# Patient Record
Sex: Male | Born: 1960
Health system: Southern US, Community
[De-identification: ages and names within clinical notes are randomized; demographics above are authoritative.]

## PROBLEM LIST (undated history)

## (undated) ENCOUNTER — Emergency Department (HOSPITAL_COMMUNITY): Admission: EM | Payer: Self-pay | Source: Home / Self Care

## (undated) DIAGNOSIS — T7840XA Allergy, unspecified, initial encounter: Secondary | ICD-10-CM

## (undated) DIAGNOSIS — R079 Chest pain, unspecified: Secondary | ICD-10-CM

## (undated) DIAGNOSIS — I82419 Acute embolism and thrombosis of unspecified femoral vein: Secondary | ICD-10-CM

## (undated) DIAGNOSIS — D689 Coagulation defect, unspecified: Secondary | ICD-10-CM

## (undated) DIAGNOSIS — E119 Type 2 diabetes mellitus without complications: Secondary | ICD-10-CM

## (undated) DIAGNOSIS — I10 Essential (primary) hypertension: Secondary | ICD-10-CM

## (undated) HISTORY — DX: Coagulation defect, unspecified: D68.9

## (undated) HISTORY — DX: Allergy, unspecified, initial encounter: T78.40XA

## (undated) HISTORY — PX: WISDOM TOOTH EXTRACTION: SHX21

## (undated) HISTORY — DX: Chest pain, unspecified: R07.9

## (undated) HISTORY — DX: Essential (primary) hypertension: I10

---

## 1999-11-09 ENCOUNTER — Encounter: Payer: Self-pay | Admitting: Emergency Medicine

## 1999-11-09 ENCOUNTER — Emergency Department (HOSPITAL_COMMUNITY): Admission: EM | Admit: 1999-11-09 | Discharge: 1999-11-09 | Payer: Self-pay | Admitting: Emergency Medicine

## 2003-01-13 ENCOUNTER — Emergency Department (HOSPITAL_COMMUNITY): Admission: EM | Admit: 2003-01-13 | Discharge: 2003-01-13 | Payer: Self-pay | Admitting: Emergency Medicine

## 2003-03-02 ENCOUNTER — Ambulatory Visit (HOSPITAL_BASED_OUTPATIENT_CLINIC_OR_DEPARTMENT_OTHER): Admission: RE | Admit: 2003-03-02 | Discharge: 2003-03-02 | Payer: Self-pay | Admitting: Urology

## 2003-06-01 ENCOUNTER — Emergency Department (HOSPITAL_COMMUNITY): Admission: EM | Admit: 2003-06-01 | Discharge: 2003-06-01 | Payer: Self-pay | Admitting: Emergency Medicine

## 2003-06-01 ENCOUNTER — Encounter: Payer: Self-pay | Admitting: Emergency Medicine

## 2004-04-02 ENCOUNTER — Emergency Department (HOSPITAL_COMMUNITY): Admission: EM | Admit: 2004-04-02 | Discharge: 2004-04-02 | Payer: Self-pay

## 2005-02-26 ENCOUNTER — Emergency Department (HOSPITAL_COMMUNITY): Admission: EM | Admit: 2005-02-26 | Discharge: 2005-02-26 | Payer: Self-pay | Admitting: Emergency Medicine

## 2005-07-10 ENCOUNTER — Emergency Department (HOSPITAL_COMMUNITY): Admission: EM | Admit: 2005-07-10 | Discharge: 2005-07-10 | Payer: Self-pay | Admitting: Emergency Medicine

## 2007-07-01 ENCOUNTER — Emergency Department (HOSPITAL_COMMUNITY): Admission: EM | Admit: 2007-07-01 | Discharge: 2007-07-01 | Payer: Self-pay | Admitting: Emergency Medicine

## 2007-10-05 ENCOUNTER — Emergency Department (HOSPITAL_COMMUNITY): Admission: EM | Admit: 2007-10-05 | Discharge: 2007-10-05 | Payer: Self-pay | Admitting: Emergency Medicine

## 2009-04-06 ENCOUNTER — Emergency Department (HOSPITAL_COMMUNITY): Admission: EM | Admit: 2009-04-06 | Discharge: 2009-04-06 | Payer: Self-pay | Admitting: Emergency Medicine

## 2009-09-03 ENCOUNTER — Emergency Department (HOSPITAL_COMMUNITY): Admission: EM | Admit: 2009-09-03 | Discharge: 2009-09-03 | Payer: Self-pay | Admitting: Emergency Medicine

## 2010-03-25 ENCOUNTER — Emergency Department (HOSPITAL_COMMUNITY): Admission: EM | Admit: 2010-03-25 | Discharge: 2010-03-25 | Payer: Self-pay | Admitting: Emergency Medicine

## 2011-01-27 LAB — CBC
Hemoglobin: 12.8 g/dL — ABNORMAL LOW (ref 13.0–17.0)
RBC: 4.35 MIL/uL (ref 4.22–5.81)
WBC: 3.6 10*3/uL — ABNORMAL LOW (ref 4.0–10.5)

## 2011-01-27 LAB — POCT I-STAT, CHEM 8
Chloride: 104 mEq/L (ref 96–112)
Creatinine, Ser: 0.9 mg/dL (ref 0.4–1.5)
Glucose, Bld: 221 mg/dL — ABNORMAL HIGH (ref 70–99)
HCT: 39 % (ref 39.0–52.0)
Potassium: 4 mEq/L (ref 3.5–5.1)
Sodium: 140 mEq/L (ref 135–145)

## 2011-01-27 LAB — DIFFERENTIAL
Basophils Relative: 0 % (ref 0–1)
Lymphocytes Relative: 27 % (ref 12–46)
Monocytes Relative: 9 % (ref 3–12)
Neutro Abs: 2.3 10*3/uL (ref 1.7–7.7)

## 2011-02-13 LAB — URINALYSIS, ROUTINE W REFLEX MICROSCOPIC
Bilirubin Urine: NEGATIVE
Glucose, UA: NEGATIVE mg/dL
Hgb urine dipstick: NEGATIVE
Specific Gravity, Urine: 1.023 (ref 1.005–1.030)
pH: 5.5 (ref 5.0–8.0)

## 2011-02-13 LAB — CBC
HCT: 39.7 % (ref 39.0–52.0)
Hemoglobin: 13.4 g/dL (ref 13.0–17.0)
MCV: 86.1 fL (ref 78.0–100.0)
Platelets: 214 10*3/uL (ref 150–400)
WBC: 4.5 10*3/uL (ref 4.0–10.5)

## 2011-02-13 LAB — BASIC METABOLIC PANEL
BUN: 14 mg/dL (ref 6–23)
Chloride: 103 mEq/L (ref 96–112)
Glucose, Bld: 147 mg/dL — ABNORMAL HIGH (ref 70–99)
Potassium: 3.7 mEq/L (ref 3.5–5.1)
Sodium: 138 mEq/L (ref 135–145)

## 2011-02-13 LAB — DIFFERENTIAL
Basophils Relative: 0 % (ref 0–1)
Eosinophils Relative: 1 % (ref 0–5)
Lymphs Abs: 1.2 10*3/uL (ref 0.7–4.0)
Monocytes Absolute: 0.3 10*3/uL (ref 0.1–1.0)
Monocytes Relative: 6 % (ref 3–12)
Neutro Abs: 3 10*3/uL (ref 1.7–7.7)

## 2011-02-13 LAB — HEMOCCULT GUIAC POC 1CARD (OFFICE): Fecal Occult Bld: NEGATIVE

## 2011-02-13 LAB — URINE MICROSCOPIC-ADD ON

## 2011-03-28 NOTE — Op Note (Signed)
   NAME:  Antonio Johnson, MIKESELL NO.:  [id number]   MEDICAL RECORD NO.:  [id number]                   PATIENT TYPE:  AMB   LOCATION:  [facility]                                 FACILITY:  [facility]   PHYSICIAN:  Excell Seltzer. Annabell Howells, M.D.                 DATE OF BIRTH:  07-Jul-1961   DATE OF PROCEDURE:  03/02/2003  DATE OF DISCHARGE:                                 OPERATIVE REPORT   PROCEDURE:  Circumcision.   PREOPERATIVE DIAGNOSES:  Phimosis.   POSTOPERATIVE DIAGNOSES:  Phimosis.   SURGEON:  Excell Seltzer. Annabell Howells, M.D.   ANESTHESIA:  General.   COMPLICATIONS:  None.   INDICATIONS FOR PROCEDURE:  [name] is a 50 year old black male who has  complained of some mild phimosis and is interested in circumcision.   DESCRIPTION OF PROCEDURE:  The patient was taken to the operating room where  a general anesthetic was induced. He was placed in the supine position. His  genitalia was prepped with Betadine solution, he was draped in the usual  sterile fashion. A sleeve resection of redundant foreskin was performed  after careful measuring to ensure adequate residual skin. Once the skin had  been resected, hemostasis was achieved with the Bovie. The skin edges were  then reapproximated using 4-0 Chromic interrupted in the quadrants. Once the  procedure had been completed, a penile block was performed with 8 mL of  0.25% lidocaine, the wound was cleansed and a dressing of Xeroform, cling  and Coban was applied. The patient's anesthetic was reversed, he was removed  to the recovery room in stable condition and there were no complications.                                               Excell Seltzer. Annabell Howells, M.D.    JJW/MEDQ  D:  03/02/2003  T:  03/02/2003  Job:  [id number]

## 2011-08-31 ENCOUNTER — Emergency Department (HOSPITAL_COMMUNITY)
Admission: EM | Admit: 2011-08-31 | Discharge: 2011-08-31 | Disposition: A | Discharge status: Home / Self Care | Payer: 59 | Attending: Emergency Medicine | Admitting: Emergency Medicine

## 2011-08-31 DIAGNOSIS — H9209 Otalgia, unspecified ear: Secondary | ICD-10-CM | POA: Insufficient documentation

## 2011-08-31 DIAGNOSIS — J069 Acute upper respiratory infection, unspecified: Secondary | ICD-10-CM | POA: Insufficient documentation

## 2011-08-31 DIAGNOSIS — J343 Hypertrophy of nasal turbinates: Secondary | ICD-10-CM | POA: Insufficient documentation

## 2011-08-31 DIAGNOSIS — J3489 Other specified disorders of nose and nasal sinuses: Secondary | ICD-10-CM | POA: Insufficient documentation

## 2011-12-03 ENCOUNTER — Emergency Department (HOSPITAL_COMMUNITY): Payer: 59

## 2011-12-03 ENCOUNTER — Other Ambulatory Visit: Payer: Self-pay

## 2011-12-03 ENCOUNTER — Emergency Department (HOSPITAL_COMMUNITY)
Admission: EM | Admit: 2011-12-03 | Discharge: 2011-12-03 | Disposition: A | Discharge status: Home / Self Care | Payer: 59 | Attending: Emergency Medicine | Admitting: Emergency Medicine

## 2011-12-03 ENCOUNTER — Encounter (HOSPITAL_COMMUNITY): Payer: Self-pay | Admitting: *Deleted

## 2011-12-03 DIAGNOSIS — R0789 Other chest pain: Secondary | ICD-10-CM | POA: Insufficient documentation

## 2011-12-03 DIAGNOSIS — R05 Cough: Secondary | ICD-10-CM | POA: Insufficient documentation

## 2011-12-03 DIAGNOSIS — R059 Cough, unspecified: Secondary | ICD-10-CM | POA: Insufficient documentation

## 2011-12-03 DIAGNOSIS — J3489 Other specified disorders of nose and nasal sinuses: Secondary | ICD-10-CM | POA: Insufficient documentation

## 2011-12-03 LAB — BASIC METABOLIC PANEL
BUN: 14 mg/dL (ref 6–23)
Chloride: 103 mEq/L (ref 96–112)
GFR calc non Af Amer: 66 mL/min — ABNORMAL LOW (ref >90)
Glucose, Bld: 146 mg/dL — ABNORMAL HIGH (ref 70–99)
Potassium: 3.7 mEq/L (ref 3.5–5.1)
Sodium: 138 mEq/L (ref 135–145)

## 2011-12-03 LAB — CBC
HCT: 39.1 % (ref 39.0–52.0)
Hemoglobin: 13.5 g/dL (ref 13.0–17.0)
RBC: 4.66 MIL/uL (ref 4.22–5.81)
WBC: 3.7 10*3/uL — ABNORMAL LOW (ref 4.0–10.5)

## 2011-12-03 LAB — DIFFERENTIAL
Basophils Relative: 0 % (ref 0–1)
Eosinophils Absolute: 0 10*3/uL (ref 0.0–0.7)
Lymphs Abs: 1.2 10*3/uL (ref 0.7–4.0)
Monocytes Relative: 8 % (ref 3–12)
Neutro Abs: 2.1 10*3/uL (ref 1.7–7.7)
Neutrophils Relative %: 59 % (ref 43–77)

## 2011-12-03 LAB — CARDIAC PANEL(CRET KIN+CKTOT+MB+TROPI): Relative Index: 0.8 (ref 0.0–2.5)

## 2011-12-03 LAB — TROPONIN I: Troponin I: <0.3 ng/mL (ref <0.30)

## 2011-12-03 LAB — LIPASE, BLOOD: Lipase: 26 U/L (ref 11–59)

## 2011-12-03 MED ORDER — GI COCKTAIL ~~LOC~~
30.0000 mL | Freq: Once | ORAL | Status: AC
  Administered 2011-12-03: 30 mL via ORAL
  Filled 2011-12-03: qty 30

## 2011-12-03 MED ORDER — PROMETHAZINE HCL 25 MG/ML IJ SOLN
12.5000 mg | Freq: Once | INTRAMUSCULAR | Status: DC
  Filled 2011-12-03: qty 1

## 2011-12-03 MED ORDER — HYDROMORPHONE HCL PF 1 MG/ML IJ SOLN: 1.0000 mg | Freq: Once | INTRAMUSCULAR | Status: DC

## 2011-12-03 MED ORDER — PANTOPRAZOLE SODIUM 20 MG PO TBEC: 20.0000 mg | DELAYED_RELEASE_TABLET | Freq: Every day | ORAL | Status: DC

## 2011-12-03 NOTE — ED Provider Notes (Signed)
History     CSN: [id number]  Arrival date & time 12/03/11  2440   First MD Initiated Contact with Patient 12/03/11 1011      Chief Complaint  Patient presents with  . Chest Pain    (Consider location/radiation/quality/duration/timing/severity/associated sxs/prior treatment) HPI Comments: Pt presents with burning to center of chest for last 3 days.  Only with coughing.  Has had some cough/congestion for 3 days.  No n/v/d.  Had some vomiting after coughing yesterday.  No abd pain.  Is burning to center of chest and epigastric area.  Does not hurt when he is not coughing.  No SOB.  No exertional pain.  No fevers/chills.  No hx of heart problems in past.   FH is positive for early CAD in both his mom and dad, but says that his dad was an alcoholic and his mom was a dialysis pt.  The history is provided by the patient.    History reviewed. No pertinent past medical history.  History reviewed. No pertinent past surgical history.  No family history on file.  History  Substance Use Topics  . Smoking status: Never Smoker   . Smokeless tobacco: Not on file  . Alcohol Use: No      Review of Systems  Constitutional: Negative for fever, chills, diaphoresis and fatigue.  HENT: Positive for congestion, rhinorrhea and postnasal drip. Negative for sneezing.   Eyes: Negative.   Respiratory: Positive for cough. Negative for chest tightness and shortness of breath.   Cardiovascular: Positive for chest pain. Negative for leg swelling.  Gastrointestinal: Negative for nausea, vomiting, abdominal pain, diarrhea and blood in stool.  Genitourinary: Negative for frequency, hematuria, flank pain and difficulty urinating.  Musculoskeletal: Negative for back pain and arthralgias.  Skin: Negative for rash.  Neurological: Negative for dizziness, speech difficulty, weakness, numbness and headaches.    Allergies  Review of patient's allergies indicates no known allergies.  Home Medications    Current Outpatient Rx  Name Route Sig Dispense Refill  . ACETAMINOPHEN 500 MG PO TABS Oral Take 500 mg by mouth every 6 (six) hours as needed. pain    . IBUPROFEN 200 MG PO TABS Oral Take 200 mg by mouth every 6 (six) hours as needed. pain    . ADULT MULTIVITAMIN W/MINERALS CH Oral Take 1 tablet by mouth daily.    Marland Kitchen PANTOPRAZOLE SODIUM 20 MG PO TBEC Oral Take 1 tablet (20 mg total) by mouth daily. 30 tablet 0    BP 139/92  Pulse 60  Temp(Src) 97.9 F (36.6 C) (Oral)  Resp 16  SpO2 96%  Physical Exam  Constitutional: He is oriented to person, place, and time. He appears well-developed and well-nourished.  HENT:  Head: Normocephalic and atraumatic.  Eyes: Pupils are equal, round, and reactive to light.  Neck: Normal range of motion. Neck supple.  Cardiovascular: Normal rate, regular rhythm and normal heart sounds.   Pulmonary/Chest: Effort normal and breath sounds normal. No respiratory distress. He has no wheezes. He has no rales. He exhibits no tenderness.  Abdominal: Soft. Bowel sounds are normal. There is no tenderness. There is no rebound and no guarding.  Musculoskeletal: Normal range of motion. He exhibits no edema.  Lymphadenopathy:    He has no cervical adenopathy.  Neurological: He is alert and oriented to person, place, and time.  Skin: Skin is warm and dry. No rash noted.  Psychiatric: He has a normal mood and affect.    ED Course  Procedures (including critical  care time)   Date: 12/03/2011  Rate: 72  Rhythm: normal sinus rhythm  QRS Axis: normal  Intervals: normal  ST/T Wave abnormalities: normal  Conduction Disutrbances:none  Narrative Interpretation:   Old EKG Reviewed: unchanged  Results for orders placed during the hospital encounter of 12/03/11  CBC      Component Value Range   WBC 3.7 (*) 4.0 - 10.5 (K/uL)   RBC 4.66  4.22 - 5.81 (MIL/uL)   Hemoglobin 13.5  13.0 - 17.0 (g/dL)   HCT 16.1  09.6 - 04.5 (%)   MCV 83.9  78.0 - 100.0 (fL)   MCH  29.0  26.0 - 34.0 (pg)   MCHC 34.5  30.0 - 36.0 (g/dL)   RDW 40.9  81.1 - 91.4 (%)   Platelets 215  150 - 400 (K/uL)  BASIC METABOLIC PANEL      Component Value Range   Sodium 138  135 - 145 (mEq/L)   Potassium 3.7  3.5 - 5.1 (mEq/L)   Chloride 103  96 - 112 (mEq/L)   CO2 25  19 - 32 (mEq/L)   Glucose, Bld 146 (*) 70 - 99 (mg/dL)   BUN 14  6 - 23 (mg/dL)   Creatinine, Ser 7.82  0.50 - 1.35 (mg/dL)   Calcium 9.4  8.4 - 95.6 (mg/dL)   GFR calc non Af Amer 66 (*) >90 (mL/min)   GFR calc Af Amer 76 (*) >90 (mL/min)  PRO B NATRIURETIC PEPTIDE      Component Value Range   Pro B Natriuretic peptide (BNP) 9.6  0 - 125 (pg/mL)  TROPONIN I      Component Value Range   Troponin I <0.30  <0.30 (ng/mL)  DIFFERENTIAL      Component Value Range   Neutrophils Relative 59  43 - 77 (%)   Neutro Abs 2.1  1.7 - 7.7 (K/uL)   Lymphocytes Relative 33  12 - 46 (%)   Lymphs Abs 1.2  0.7 - 4.0 (K/uL)   Monocytes Relative 8  3 - 12 (%)   Monocytes Absolute 0.3  0.1 - 1.0 (K/uL)   Eosinophils Relative 1  0 - 5 (%)   Eosinophils Absolute 0.0  0.0 - 0.7 (K/uL)   Basophils Relative 0  0 - 1 (%)   Basophils Absolute 0.0  0.0 - 0.1 (K/uL)  LIPASE, BLOOD      Component Value Range   Lipase 26  11 - 59 (U/L)  CARDIAC PANEL(CRET KIN+CKTOT+MB+TROPI)      Component Value Range   Total CK 695 (*) 7 - 232 (U/L)   CK, MB 5.3 (*) 0.3 - 4.0 (ng/mL)   Troponin I <0.30  <0.30 (ng/mL)   Relative Index 0.8  0.0 - 2.5    Chest Portable 1 View  12/03/2011  *RADIOLOGY REPORT*  Clinical Data: Chest pain.  PORTABLE CHEST - 1 VIEW  Comparison: 04/02/2004  Findings: Heart size and pulmonary vascularity are normal and the lungs are clear.  No osseous abnormality.  IMPRESSION:  Normal chest.  Original Report Authenticated By: Gwynn Burly, M.D.       1. Chest pain       MDM  Pt pain free after GI cocktail.  Does not sound cardiac. Likely GI.  Will start on PPI, f/u with PMD.  Advised to return for any worsening  symptoms.        Rolan Bucco, MD 12/03/11 1319

## 2011-12-03 NOTE — ED Notes (Signed)
EKG was handed to EDP

## 2011-12-03 NOTE — ED Notes (Signed)
Pt reports burning chest pain that began 2-3 days ago. Vomiting x3 yesterday. Nausea present now. Burning is constant, sts worse when swallowing.

## 2012-05-01 ENCOUNTER — Emergency Department (HOSPITAL_COMMUNITY)
Admission: EM | Admit: 2012-05-01 | Discharge: 2012-05-01 | Disposition: A | Discharge status: Home / Self Care | Payer: 59 | Attending: Emergency Medicine | Admitting: Emergency Medicine

## 2012-05-01 ENCOUNTER — Encounter (HOSPITAL_COMMUNITY): Payer: Self-pay | Admitting: *Deleted

## 2012-05-01 DIAGNOSIS — M25519 Pain in unspecified shoulder: Secondary | ICD-10-CM | POA: Insufficient documentation

## 2012-05-01 DIAGNOSIS — R21 Rash and other nonspecific skin eruption: Secondary | ICD-10-CM | POA: Insufficient documentation

## 2012-05-01 DIAGNOSIS — B029 Zoster without complications: Secondary | ICD-10-CM

## 2012-05-01 MED ORDER — VALACYCLOVIR HCL 1 G PO TABS: 1000.0000 mg | ORAL_TABLET | Freq: Three times a day (TID) | ORAL | Status: AC

## 2012-05-01 MED ORDER — OXYCODONE-ACETAMINOPHEN 5-325 MG PO TABS: 1.0000 | ORAL_TABLET | Freq: Four times a day (QID) | ORAL | Status: AC | PRN

## 2012-05-01 NOTE — ED Notes (Signed)
Pt states he has had right scapula pain radiating in to right axilla x1 week.  Pain increases when he raises right arm above his head or out to the side.  Pt does not recall injuring himself, but states he does do some heavy lifting at work.  Pt denies numbness/tingling and states pain does not radiate in to arm.  Pt has good radial pulse in right arm.  Pt denies cough and pain with inspiration/expiration.

## 2012-05-01 NOTE — Discharge Instructions (Signed)

## 2012-05-01 NOTE — ED Provider Notes (Signed)
History     CSN: [id number]  Arrival date & time 05/01/12  1152   First MD Initiated Contact with Patient 05/01/12 1223      Chief Complaint  Patient presents with  . Shoulder Pain    right    (Consider location/radiation/quality/duration/timing/severity/associated sxs/prior treatment) HPI Comments: Patient reports that he has had pain of his right scapula for the past week.  Pain radiating around to his right axilla.  No acute injury or trauma.  No swelling.  Full ROM of shoulder.  No numbness or tingling.  Today he noticed a rash on the right side of his back and the right side of his chest.  Rash is painful.  He has had Chickenpox in the past.  No prior history of Shingles.  He has not taken anything for the pain.    Patient is a 51 y.o. male presenting with shoulder pain. The history is provided by the patient.  Shoulder Pain Associated symptoms include a rash. Pertinent negatives include no chills, fever, joint swelling, nausea, numbness, vomiting or weakness.    History reviewed. No pertinent past medical history.  History reviewed. No pertinent past surgical history.  No family history on file.  History  Substance Use Topics  . Smoking status: Never Smoker   . Smokeless tobacco: Not on file  . Alcohol Use: No      Review of Systems  Constitutional: Negative for fever and chills.  Respiratory: Negative for shortness of breath.   Gastrointestinal: Negative for nausea and vomiting.  Musculoskeletal: Negative for back pain, joint swelling and gait problem.  Skin: Positive for rash.  Neurological: Negative for syncope, weakness and numbness.    Allergies  Review of patient's allergies indicates no known allergies.  Home Medications   Current Outpatient Rx  Name Route Sig Dispense Refill  . ADULT MULTIVITAMIN W/MINERALS CH Oral Take 1 tablet by mouth daily.      BP 140/90  Pulse 85  Temp 98.2 F (36.8 C) (Oral)  Resp 18  SpO2 97%  Physical Exam    Nursing note and vitals reviewed. Constitutional: He appears well-developed and well-nourished.  HENT:  Head: Normocephalic and atraumatic.  Mouth/Throat: Oropharynx is clear and moist.  Neck: Normal range of motion. Neck supple.  Cardiovascular: Normal rate, regular rhythm and normal heart sounds.   Pulmonary/Chest: Effort normal and breath sounds normal. No respiratory distress. He has no wheezes.  Musculoskeletal: Normal range of motion. He exhibits no edema.       Right shoulder: He exhibits normal range of motion, no tenderness, no bony tenderness, no swelling, normal pulse and normal strength.  Neurological: He is alert. He has normal strength. No sensory deficit.  Skin: Skin is warm and dry. Rash noted.     Psychiatric: He has a normal mood and affect.    ED Course  Procedures (including critical care time)  Labs Reviewed - No data to display No results found.   No diagnosis found.    MDM  Rash consistent with Shingles.  Patient given prescription for Acyclovir and pain medication.        Pascal Lux Cuartelez, PA-C 05/02/12 1936

## 2012-05-05 NOTE — ED Provider Notes (Signed)
Medical screening examination/treatment/procedure(s) were performed by non-physician practitioner and as supervising physician I was immediately available for consultation/collaboration.   Eman Morimoto, MD 05/05/12 1557 

## 2013-03-11 ENCOUNTER — Ambulatory Visit: Payer: Self-pay | Admitting: Neurology

## 2016-04-01 ENCOUNTER — Emergency Department (HOSPITAL_COMMUNITY)
Admission: EM | Admit: 2016-04-01 | Discharge: 2016-04-01 | Disposition: A | Discharge status: Home / Self Care | Payer: Commercial Managed Care - HMO | Attending: Emergency Medicine | Admitting: Emergency Medicine

## 2016-04-01 ENCOUNTER — Encounter (HOSPITAL_COMMUNITY): Payer: Self-pay | Admitting: *Deleted

## 2016-04-01 DIAGNOSIS — R42 Dizziness and giddiness: Secondary | ICD-10-CM | POA: Insufficient documentation

## 2016-04-01 DIAGNOSIS — R112 Nausea with vomiting, unspecified: Secondary | ICD-10-CM | POA: Diagnosis not present

## 2016-04-01 DIAGNOSIS — R197 Diarrhea, unspecified: Secondary | ICD-10-CM | POA: Insufficient documentation

## 2016-04-01 LAB — COMPREHENSIVE METABOLIC PANEL
ALT: 40 U/L (ref 17–63)
ANION GAP: 7 (ref 5–15)
AST: 37 U/L (ref 15–41)
Albumin: 4.5 g/dL (ref 3.5–5.0)
Alkaline Phosphatase: 65 U/L (ref 38–126)
BUN: 13 mg/dL (ref 6–20)
CHLORIDE: 104 mmol/L (ref 101–111)
CO2: 27 mmol/L (ref 22–32)
CREATININE: 0.99 mg/dL (ref 0.61–1.24)
Calcium: 9.4 mg/dL (ref 8.9–10.3)
Glucose, Bld: 136 mg/dL — ABNORMAL HIGH (ref 65–99)
POTASSIUM: 3.3 mmol/L — AB (ref 3.5–5.1)
Sodium: 138 mmol/L (ref 135–145)
Total Bilirubin: 0.5 mg/dL (ref 0.3–1.2)
Total Protein: 8.1 g/dL (ref 6.5–8.1)

## 2016-04-01 LAB — CBC
HEMATOCRIT: 38.4 % — AB (ref 39.0–52.0)
Hemoglobin: 13.4 g/dL (ref 13.0–17.0)
MCH: 28.3 pg (ref 26.0–34.0)
MCHC: 34.9 g/dL (ref 30.0–36.0)
MCV: 81.2 fL (ref 78.0–100.0)
PLATELETS: 247 10*3/uL (ref 150–400)
RBC: 4.73 MIL/uL (ref 4.22–5.81)
RDW: 13.3 % (ref 11.5–15.5)
WBC: 4.7 10*3/uL (ref 4.0–10.5)

## 2016-04-01 LAB — LIPASE, BLOOD: LIPASE: 16 U/L (ref 11–51)

## 2016-04-01 MED ORDER — ONDANSETRON 4 MG PO TBDP: 4.0000 mg | ORAL_TABLET | Freq: Three times a day (TID) | ORAL | Status: DC | PRN

## 2016-04-01 MED ORDER — ONDANSETRON 4 MG PO TBDP
4.0000 mg | ORAL_TABLET | Freq: Once | ORAL | Status: AC
  Administered 2016-04-01: 4 mg via ORAL
  Filled 2016-04-01: qty 1

## 2016-04-01 MED ORDER — SODIUM CHLORIDE 0.9 % IV BOLUS (SEPSIS)
1000.0000 mL | Freq: Once | INTRAVENOUS | Status: AC
  Administered 2016-04-01: 1000 mL via INTRAVENOUS

## 2016-04-01 MED ORDER — DICYCLOMINE HCL 20 MG PO TABS: 20.0000 mg | ORAL_TABLET | Freq: Two times a day (BID) | ORAL | Status: DC

## 2016-04-01 NOTE — Discharge Instructions (Signed)
Food Choices to Help Relieve Diarrhea, Adult When you have diarrhea, the foods you eat and your eating habits are very important. Choosing the right foods and drinks can help relieve diarrhea. Also, because diarrhea can last up to 7 days, you need to replace lost fluids and electrolytes (such as sodium, potassium, and chloride) in order to help prevent dehydration.  WHAT GENERAL GUIDELINES DO I NEED TO FOLLOW?  Slowly drink 1 cup (8 oz) of fluid for each episode of diarrhea. If you are getting enough fluid, your urine will be clear or pale yellow.  Eat starchy foods. Some good choices include white rice, white toast, pasta, low-fiber cereal, baked potatoes (without the skin), saltine crackers, and bagels.  Avoid large servings of any cooked vegetables.  Limit fruit to two servings per day. A serving is  cup or 1 small piece.  Choose foods with less than 2 g of fiber per serving.  Limit fats to less than 8 tsp (38 g) per day.  Avoid fried foods.  Eat foods that have probiotics in them. Probiotics can be found in certain dairy products.  Avoid foods and beverages that may increase the speed at which food moves through the stomach and intestines (gastrointestinal tract). Things to avoid include:  High-fiber foods, such as dried fruit, raw fruits and vegetables, nuts, seeds, and whole grain foods.  Spicy foods and high-fat foods.  Foods and beverages sweetened with high-fructose corn syrup, honey, or sugar alcohols such as xylitol, sorbitol, and mannitol. WHAT FOODS ARE RECOMMENDED? Grains White rice. White, [place], or pita breads (fresh or toasted), including plain rolls, buns, or bagels. White pasta. Saltine, soda, or graham crackers. Pretzels. Low-fiber cereal. Cooked cereals made with water (such as cornmeal, farina, or cream cereals). Plain muffins. Matzo. Melba toast. Zwieback.  Vegetables Potatoes (without the skin). Strained tomato and vegetable juices. Most well-cooked and canned  vegetables without seeds. Tender lettuce. Fruits Cooked or canned applesauce, apricots, cherries, fruit cocktail, grapefruit, peaches, pears, or plums. Fresh bananas, apples without skin, cherries, grapes, cantaloupe, grapefruit, peaches, oranges, or plums.  Meat and Other Protein Products Baked or boiled chicken. Eggs. Tofu. Fish. Seafood. Smooth peanut butter. Ground or well-cooked tender beef, ham, veal, lamb, pork, or poultry.  Dairy Plain yogurt, kefir, and unsweetened liquid yogurt. Lactose-free milk, buttermilk, or soy milk. Plain hard cheese. Beverages Sport drinks. Clear broths. Diluted fruit juices (except prune). Regular, caffeine-free sodas such as ginger ale. Water. Decaffeinated teas. Oral rehydration solutions. Sugar-free beverages not sweetened with sugar alcohols. Other Bouillon, broth, or soups made from recommended foods.  The items listed above may not be a complete list of recommended foods or beverages. Contact your dietitian for more options. WHAT FOODS ARE NOT RECOMMENDED? Grains Whole grain, whole wheat, bran, or rye breads, rolls, pastas, crackers, and cereals. Wild or brown rice. Cereals that contain more than 2 g of fiber per serving. Corn tortillas or taco shells. Cooked or dry oatmeal. Granola. Popcorn. Vegetables Raw vegetables. Cabbage, broccoli, Brussels sprouts, artichokes, baked beans, beet greens, corn, kale, legumes, peas, sweet potatoes, and yams. Potato skins. Cooked spinach and cabbage. Fruits Dried fruit, including raisins and dates. Raw fruits. Stewed or dried prunes. Fresh apples with skin, apricots, mangoes, pears, raspberries, and strawberries.  Meat and Other Protein Products Chunky peanut butter. Nuts and seeds. Beans and lentils. Berniece Salines.  Dairy High-fat cheeses. Milk, chocolate milk, and beverages made with milk, such as milk shakes. Cream. Ice cream. Sweets and Desserts Sweet rolls, doughnuts, and sweet breads.  Pancakes and waffles. Fats and  Oils Butter. Cream sauces. Margarine. Salad oils. Plain salad dressings. Olives. Avocados.  Beverages Caffeinated beverages (such as coffee, tea, soda, or energy drinks). Alcoholic beverages. Fruit juices with pulp. Prune juice. Soft drinks sweetened with high-fructose corn syrup or sugar alcohols. Other Coconut. Hot sauce. Chili powder. Mayonnaise. Gravy. Cream-based or milk-based soups.  The items listed above may not be a complete list of foods and beverages to avoid. Contact your dietitian for more information. WHAT SHOULD I DO IF I BECOME DEHYDRATED? Diarrhea can sometimes lead to dehydration. Signs of dehydration include dark urine and dry mouth and skin. If you think you are dehydrated, you should rehydrate with an oral rehydration solution. These solutions can be purchased at pharmacies, retail stores, or online.  Drink -1 cup (120-240 mL) of oral rehydration solution each time you have an episode of diarrhea. If drinking this amount makes your diarrhea worse, try drinking smaller amounts more often. For example, drink 1-3 tsp (5-15 mL) every 5-10 minutes.  A general rule for staying hydrated is to drink 1-2 L of fluid per day. Talk to your health care provider about the specific amount you should be drinking each day. Drink enough fluids to keep your urine clear or pale yellow.   This information is not intended to replace advice given to you by your health care provider. Make sure you discuss any questions you have with your health care provider.   Document Released: 01/17/2004 Document Revised: 11/17/2014 Document Reviewed: 09/19/2013 Elsevier Interactive Patient Education 2016 [place]. Diarrhea Diarrhea is frequent loose and watery bowel movements. It can cause you to feel weak and dehydrated. Dehydration can cause you to become tired and thirsty, have a dry mouth, and have decreased urination that often is dark yellow. Diarrhea is a sign of another problem, most often an  infection that will not last long. In most cases, diarrhea typically lasts 2-3 days. However, it can last longer if it is a sign of something more serious. It is important to treat your diarrhea as directed by your caregiver to lessen or prevent future episodes of diarrhea. CAUSES  Some common causes include:  Gastrointestinal infections caused by viruses, bacteria, or parasites.  Food poisoning or food allergies.  Certain medicines, such as antibiotics, chemotherapy, and laxatives.  Artificial sweeteners and fructose.  Digestive disorders. HOME CARE INSTRUCTIONS  Ensure adequate fluid intake (hydration): Have 1 cup (8 oz) of fluid for each diarrhea episode. Avoid fluids that contain simple sugars or sports drinks, fruit juices, whole milk products, and sodas. Your urine should be clear or pale yellow if you are drinking enough fluids. Hydrate with an oral rehydration solution that you can purchase at pharmacies, retail stores, and online. You can prepare an oral rehydration solution at home by mixing the following ingredients together:   - tsp table salt.   tsp baking soda.   tsp salt substitute containing potassium chloride.  1  tablespoons sugar.  1 L (34 oz) of water.  Certain foods and beverages may increase the speed at which food moves through the gastrointestinal (GI) tract. These foods and beverages should be avoided and include:  Caffeinated and alcoholic beverages.  High-fiber foods, such as raw fruits and vegetables, nuts, seeds, and whole grain breads and cereals.  Foods and beverages sweetened with sugar alcohols, such as xylitol, sorbitol, and mannitol.  Some foods may be well tolerated and may help thicken stool including:  Starchy foods, such as rice, toast, pasta,  low-sugar cereal, oatmeal, grits, baked potatoes, crackers, and bagels.  Bananas.  Applesauce.  Add probiotic-rich foods to help increase healthy bacteria in the GI tract, such as yogurt and  fermented milk products.  Wash your hands well after each diarrhea episode.  Only take over-the-counter or prescription medicines as directed by your caregiver.  Take a warm bath to relieve any burning or pain from frequent diarrhea episodes. SEEK IMMEDIATE MEDICAL CARE IF:   You are unable to keep fluids down.  You have persistent vomiting.  You have blood in your stool, or your stools are black and tarry.  You do not urinate in 6-8 hours, or there is only a small amount of very dark urine.  You have abdominal pain that increases or localizes.  You have weakness, dizziness, confusion, or light-headedness.  You have a severe headache.  Your diarrhea gets worse or does not get better.  You have a fever or persistent symptoms for more than 2-3 days.  You have a fever and your symptoms suddenly get worse. MAKE SURE YOU:   Understand these instructions.  Will watch your condition.  Will get help right away if you are not doing well or get worse.   This information is not intended to replace advice given to you by your health care provider. Make sure you discuss any questions you have with your health care provider.   Document Released: 10/17/2002 Document Revised: 11/17/2014 Document Reviewed: 07/04/2012 Elsevier Interactive Patient Education 2016 Else[employer] Nausea and Vomiting Nausea is a sick feeling that often comes before throwing up (vomiting). Vomiting is a reflex where stomach contents come out of your mouth. Vomiting can cause severe loss of body fluids (dehydration). Children and elderly adults can become dehydrated quickly, especially if they also have diarrhea. Nausea and vomiting are symptoms of a condition or disease. It is important to find the cause of your symptoms. CAUSES   Direct irritation of the stomach lining. This irritation can result from increased acid production (gastroesophageal reflux disease), infection, food poisoning, taking certain medicines  (such as nonsteroidal anti-inflammatory drugs), alcohol use, or tobacco use.  Signals from the brain.These signals could be caused by a headache, heat exposure, an inner ear disturbance, increased pressure in the brain from injury, infection, a tumor, or a concussion, pain, emotional stimulus, or metabolic problems.  An obstruction in the gastrointestinal tract (bowel obstruction).  Illnesses such as diabetes, hepatitis, gallbladder problems, appendicitis, kidney problems, cancer, sepsis, atypical symptoms of a heart attack, or eating disorders.  Medical treatments such as chemotherapy and radiation.  Receiving medicine that makes you sleep (general anesthetic) during surgery. DIAGNOSIS Your caregiver may ask for tests to be done if the problems do not improve after a few days. Tests may also be done if symptoms are severe or if the reason for the nausea and vomiting is not clear. Tests may include:  Urine tests.  Blood tests.  Stool tests.  Cultures (to look for evidence of infection).  X-rays or other imaging studies. Test results can help your caregiver make decisions about treatment or the need for additional tests. TREATMENT You need to stay well hydrated. Drink frequently but in small amounts.You may wish to drink water, sports drinks, clear broth, or eat frozen ice pops or gelatin dessert to help stay hydrated.When you eat, eating slowly may help prevent nausea.There are also some antinausea medicines that may help prevent nausea. HOME CARE INSTRUCTIONS   Take all medicine as directed by your caregiver.  If  you do not have an appetite, do not force yourself to eat. However, you must continue to drink fluids.  If you have an appetite, eat a normal diet unless your caregiver tells you differently.  Eat a variety of complex carbohydrates (rice, wheat, potatoes, bread), lean meats, yogurt, fruits, and vegetables.  Avoid high-fat foods because they are more difficult to  digest.  Drink enough water and fluids to keep your urine clear or pale yellow.  If you are dehydrated, ask your caregiver for specific rehydration instructions. Signs of dehydration may include:  Severe thirst.  Dry lips and mouth.  Dizziness.  Dark urine.  Decreasing urine frequency and amount.  Confusion.  Rapid breathing or pulse. SEEK IMMEDIATE MEDICAL CARE IF:   You have blood or brown flecks (like coffee grounds) in your vomit.  You have black or bloody stools.  You have a severe headache or stiff neck.  You are confused.  You have severe abdominal pain.  You have chest pain or trouble breathing.  You do not urinate at least once every 8 hours.  You develop cold or clammy skin.  You continue to vomit for longer than 24 to 48 hours.  You have a fever. MAKE SURE YOU:   Understand these instructions.  Will watch your condition.  Will get help right away if you are not doing well or get worse.   This information is not intended to replace advice given to you by your health care provider. Make sure you discuss any questions you have with your health care provider.   Document Released: 10/27/2005 Document Revised: 01/19/2012 Document Reviewed: 03/26/2011 Elsevier Interactive Patient Education [employer].

## 2016-04-01 NOTE — ED Notes (Signed)
Pt HAVE BEEN MADE AWARE OF URINE SAMPLE

## 2016-04-01 NOTE — ED Notes (Addendum)
Pt reports abd pain with n/v/d and a h/a since early today.  Pt reports he has been around his friend who's been sick with same.  Pt report he is unable to keep anything down.

## 2016-04-01 NOTE — ED Provider Notes (Signed)
CSN: [id number]     Arrival date & time 04/01/16  1541 History   First MD Initiated Contact with Patient 04/01/16 1639     Chief Complaint  Patient presents with  . Emesis  . Diarrhea    Antonio Johnson is a 55 y.o. male who presents to the ED Complaining of nausea, vomiting and diarrhea starting today. He also complains of some generalized abdominal pain which began after he had nausea and vomiting. He reports his friend is sick at home with similar illness. He reports he has vomited 6 times today and has had multiple watery stools. No hematemesis or hematochezia. He minds of generalized abdominal pain. No previous abdominal surgeries. He has taken nothing for treatment today. He reports feeling lightheaded with position change. He denies fevers, urinary symptoms, coughing, trouble breathing, hematemesis, hematochezia.    Patient is a 55 y.o. male presenting with vomiting and diarrhea. The history is provided by the patient. No language interpreter was used.  Emesis Associated symptoms: abdominal pain and diarrhea   Associated symptoms: no chills, no headaches and no sore throat   Diarrhea Associated symptoms: abdominal pain and vomiting   Associated symptoms: no chills, no fever and no headaches     History reviewed. No pertinent past medical history. History reviewed. No pertinent past surgical history. No family history on file. Social History  Substance Use Topics  . Smoking status: Never Smoker   . Smokeless tobacco: None  . Alcohol Use: No    Review of Systems  Constitutional: Negative for fever and chills.  HENT: Negative for congestion and sore throat.   Eyes: Negative for visual disturbance.  Respiratory: Negative for cough and shortness of breath.   Cardiovascular: Negative for chest pain.  Gastrointestinal: Positive for nausea, vomiting, abdominal pain and diarrhea. Negative for blood in stool.  Genitourinary: Negative for dysuria, urgency, hematuria and difficulty  urinating.  Musculoskeletal: Negative for back pain and neck pain.  Skin: Negative for rash.  Neurological: Positive for light-headedness. Negative for dizziness, syncope and headaches.      Allergies  Review of patient's allergies indicates no known allergies.  Home Medications   Prior to Admission medications   Medication Sig Start Date End Date Taking? Authorizing Provider  Multiple Vitamin (MULITIVITAMIN WITH MINERALS) TABS Take 1 tablet by mouth daily.   Yes Historical Provider, MD  dicyclomine (BENTYL) 20 MG tablet Take 1 tablet (20 mg total) by mouth 2 (two) times daily. 04/01/16   Waynetta Pean, PA-C  ondansetron (ZOFRAN ODT) 4 MG disintegrating tablet Take 1 tablet (4 mg total) by mouth every 8 (eight) hours as needed for nausea or vomiting. 04/01/16   Waynetta Pean, PA-C   BP 148/93 mmHg  Pulse 65  Temp(Src) 98.3 F (36.8 C) (Oral)  Resp 18  Ht 6\' 6"  (1.981 m)  Wt 124.739 kg  BMI 31.79 kg/m2  SpO2 99% Physical Exam  Constitutional: He appears well-developed and well-nourished. No distress.  Nontoxic appearing.  HENT:  Head: Normocephalic and atraumatic.  Mouth/Throat: Oropharynx is clear and moist.  Eyes: Conjunctivae are normal. Pupils are equal, round, and reactive to light. Right eye exhibits no discharge. Left eye exhibits no discharge.  Neck: Neck supple.  Cardiovascular: Normal rate, regular rhythm, normal heart sounds and intact distal pulses.  Exam reveals no gallop and no friction rub.   No murmur heard. Pulmonary/Chest: Effort normal and breath sounds normal. No respiratory distress. He has no wheezes. He has no rales.  Abdominal: Soft. Bowel sounds  are normal. He exhibits no distension. There is no tenderness. There is no rebound and no guarding.  Abdomen is soft and non-tender to palpation. No peritoneal signs. No psoas or obturator sign.   Musculoskeletal: He exhibits no edema.  Lymphadenopathy:    He has no cervical adenopathy.  Neurological: He is  alert. Coordination normal.  Skin: Skin is warm and dry. No rash noted. He is not diaphoretic. No erythema. No pallor.  Psychiatric: He has a normal mood and affect. His behavior is normal.  Nursing note and vitals reviewed.   ED Course  Procedures (including critical care time) Labs Review Labs Reviewed  COMPREHENSIVE METABOLIC PANEL - Abnormal; Notable for the following:    Potassium 3.3 (*)    Glucose, Bld 136 (*)    All other components within normal limits  CBC - Abnormal; Notable for the following:    HCT 38.4 (*)    All other components within normal limits  LIPASE, BLOOD    Imaging Review No results found. I have personally reviewed and evaluated these  lab results as part of my medical decision-making.   EKG Interpretation None     Filed Vitals:   04/01/16 1611  BP: 148/93  Pulse: 65  Temp: 98.3 F (36.8 C)  TempSrc: Oral  Resp: 18  Height: 6\' 6"  (1.981 m)  Weight: 124.739 kg  SpO2: 99%     MDM   Meds given in ED:  Medications  ondansetron (ZOFRAN-ODT) disintegrating tablet 4 mg (4 mg Oral Given 04/01/16 1717)  sodium chloride 0.9 % bolus 1,000 mL (0 mLs Intravenous Stopped 04/01/16 1934)    New Prescriptions   DICYCLOMINE (BENTYL) 20 MG TABLET    Take 1 tablet (20 mg total) by mouth 2 (two) times daily.   ONDANSETRON (ZOFRAN ODT) 4 MG DISINTEGRATING TABLET    Take 1 tablet (4 mg total) by mouth every 8 (eight) hours as needed for nausea or vomiting.    Final diagnoses:  Nausea vomiting and diarrhea   This  is a 55 y.o. male who presents to the ED Complaining of nausea, vomiting and diarrhea starting today. He also complains of some generalized abdominal pain which began after he had nausea and vomiting. He reports his friend is sick at home with similar illness. He reports he has vomited 6 times today and has had multiple watery stools. No hematemesis or hematochezia. He minds of generalized abdominal pain. No previous abdominal surgeries. He has taken  nothing for treatment today. He reports feeling lightheaded with position change. On exam the patient is afebrile and nontoxic-appearing. His abdomen is soft and nontender to palpation. No peritoneal signs. Lipase is within normal limits. CMP is remarkable only for potassium of 3.3. CBC is unremarkable. Patient is provided with fluid bolus and Zofran. At reevaluation the patient has tolerated liquids without vomiting. He reports feeling much better and is ready for discharge. He is no longer lightheaded with position change. I encouraged him to push oral liquids and discussed BRAT diet. I discussed return precautions. I discharged with prescriptions for Zofran and Bentyl. I advised the patient to follow-up with their primary care provider this week. I advised the patient to return to the emergency department with new or worsening symptoms or new concerns. The patient verbalized understanding and agreement with plan.       Waynetta Pean, PA-C 04/01/16 1936  Julianne Rice, MD 04/01/16 2219

## 2016-09-02 ENCOUNTER — Encounter (HOSPITAL_COMMUNITY): Payer: Self-pay

## 2016-09-02 ENCOUNTER — Emergency Department (HOSPITAL_COMMUNITY)
Admission: EM | Admit: 2016-09-02 | Discharge: 2016-09-02 | Discharge status: Against Medical Advice | Payer: Commercial Managed Care - HMO | Attending: Emergency Medicine | Admitting: Emergency Medicine

## 2016-09-02 ENCOUNTER — Emergency Department (HOSPITAL_COMMUNITY): Payer: Commercial Managed Care - HMO

## 2016-09-02 DIAGNOSIS — R1084 Generalized abdominal pain: Secondary | ICD-10-CM | POA: Diagnosis not present

## 2016-09-02 DIAGNOSIS — Z79899 Other long term (current) drug therapy: Secondary | ICD-10-CM | POA: Diagnosis not present

## 2016-09-02 DIAGNOSIS — R1013 Epigastric pain: Secondary | ICD-10-CM | POA: Insufficient documentation

## 2016-09-02 DIAGNOSIS — R197 Diarrhea, unspecified: Secondary | ICD-10-CM | POA: Diagnosis not present

## 2016-09-02 DIAGNOSIS — R109 Unspecified abdominal pain: Secondary | ICD-10-CM

## 2016-09-02 DIAGNOSIS — R112 Nausea with vomiting, unspecified: Secondary | ICD-10-CM | POA: Diagnosis present

## 2016-09-02 DIAGNOSIS — R0789 Other chest pain: Secondary | ICD-10-CM | POA: Diagnosis not present

## 2016-09-02 DIAGNOSIS — R739 Hyperglycemia, unspecified: Secondary | ICD-10-CM

## 2016-09-02 DIAGNOSIS — R079 Chest pain, unspecified: Secondary | ICD-10-CM

## 2016-09-02 DIAGNOSIS — E1165 Type 2 diabetes mellitus with hyperglycemia: Secondary | ICD-10-CM | POA: Insufficient documentation

## 2016-09-02 DIAGNOSIS — R030 Elevated blood-pressure reading, without diagnosis of hypertension: Secondary | ICD-10-CM | POA: Diagnosis not present

## 2016-09-02 LAB — COMPREHENSIVE METABOLIC PANEL
ALK PHOS: 58 U/L (ref 38–126)
ALT: 28 U/L (ref 17–63)
AST: 30 U/L (ref 15–41)
Albumin: 4.4 g/dL (ref 3.5–5.0)
Anion gap: 9 (ref 5–15)
BUN: 14 mg/dL (ref 6–20)
CALCIUM: 9.4 mg/dL (ref 8.9–10.3)
CHLORIDE: 103 mmol/L (ref 101–111)
CO2: 25 mmol/L (ref 22–32)
CREATININE: 1.02 mg/dL (ref 0.61–1.24)
GFR calc Af Amer: >60 mL/min (ref >60)
Glucose, Bld: 265 mg/dL — ABNORMAL HIGH (ref 65–99)
Potassium: 3.5 mmol/L (ref 3.5–5.1)
SODIUM: 137 mmol/L (ref 135–145)
Total Bilirubin: 0.7 mg/dL (ref 0.3–1.2)
Total Protein: 7.6 g/dL (ref 6.5–8.1)

## 2016-09-02 LAB — URINALYSIS, ROUTINE W REFLEX MICROSCOPIC
BILIRUBIN URINE: NEGATIVE
GLUCOSE, UA: 250 mg/dL — AB
KETONES UR: NEGATIVE mg/dL
Leukocytes, UA: NEGATIVE
Nitrite: NEGATIVE
PH: 6.5 (ref 5.0–8.0)
Protein, ur: >300 mg/dL — AB
SPECIFIC GRAVITY, URINE: 1.023 (ref 1.005–1.030)

## 2016-09-02 LAB — CBC
HEMATOCRIT: 37.1 % — AB (ref 39.0–52.0)
HEMOGLOBIN: 12.6 g/dL — AB (ref 13.0–17.0)
MCH: 28.4 pg (ref 26.0–34.0)
MCHC: 34 g/dL (ref 30.0–36.0)
MCV: 83.6 fL (ref 78.0–100.0)
Platelets: 226 10*3/uL (ref 150–400)
RBC: 4.44 MIL/uL (ref 4.22–5.81)
RDW: 13.2 % (ref 11.5–15.5)
WBC: 4.2 10*3/uL (ref 4.0–10.5)

## 2016-09-02 LAB — URINE MICROSCOPIC-ADD ON

## 2016-09-02 LAB — I-STAT TROPONIN, ED: Troponin i, poc: 0 ng/mL (ref 0.00–0.08)

## 2016-09-02 LAB — LIPASE, BLOOD: Lipase: 18 U/L (ref 11–51)

## 2016-09-02 MED ORDER — ONDANSETRON 8 MG PO TBDP: 8.0000 mg | ORAL_TABLET | Freq: Three times a day (TID) | ORAL | 0 refills | Status: DC | PRN

## 2016-09-02 MED ORDER — SODIUM CHLORIDE 0.9 % IV BOLUS (SEPSIS)
1000.0000 mL | Freq: Once | INTRAVENOUS | Status: AC
  Administered 2016-09-02: 1000 mL via INTRAVENOUS

## 2016-09-02 MED ORDER — PANTOPRAZOLE SODIUM 40 MG IV SOLR
40.0000 mg | Freq: Once | INTRAVENOUS | Status: AC
  Administered 2016-09-02: 40 mg via INTRAVENOUS
  Filled 2016-09-02: qty 40

## 2016-09-02 MED ORDER — ONDANSETRON HCL 4 MG/2ML IJ SOLN
4.0000 mg | Freq: Once | INTRAMUSCULAR | Status: AC
  Administered 2016-09-02: 4 mg via INTRAVENOUS
  Filled 2016-09-02: qty 2

## 2016-09-02 MED ORDER — SODIUM CHLORIDE 0.9 % IV SOLN
INTRAVENOUS | Status: DC
Start: 2016-09-02 — End: 2016-09-02
  Administered 2016-09-02: 09:00:00 via INTRAVENOUS

## 2016-09-02 MED ORDER — FAMOTIDINE 20 MG PO TABS: 20.0000 mg | ORAL_TABLET | Freq: Two times a day (BID) | ORAL | 0 refills | Status: DC

## 2016-09-02 NOTE — Discharge Instructions (Signed)
Take Zofran as prescribed as needed for nausea and vomiting. You can try Imodium for diarrhea. Drink plenty of fluids. Your blood sugar is elevated today, make sure to watch her diet closely, I have offered her to start medications that you refused. He also refused to stay for monitoring and recheck of labs. He must find a primary care doctor follow-up closely for better management of your blood sugar and your blood pressure.

## 2016-09-02 NOTE — ED Notes (Signed)
EKG given to Dr. Knott 

## 2016-09-02 NOTE — ED Triage Notes (Addendum)
Pt c/o sharp L side chest pain, generalized abdominal pain, hematemesis x 3 episodes, and diarrhea starting yesterday.  Pain score 10/10.  Pt has not taken anything for symptoms.  Pt denies medical history.

## 2016-09-02 NOTE — ED Notes (Addendum)
Patient stating, "I will never come back to this hospital. Y'all make no sense. Making me feel worse than when I came in." This RN explained the procedures being completed and patient stated he did not want me to explain anymore. Patient being hostile with PA during assessment. Patient did not want to answer her questions.

## 2016-09-02 NOTE — ED Notes (Signed)
PA at bedside.

## 2016-09-02 NOTE — ED Notes (Addendum)
PA gave patient work note for today and tomorrow. Patient states he needs more days of work. I checked with the PA to see if an extension is needed. PA states no. Patient states, "This hospital is horrible. I will never come back again." Patient signed out AMA.

## 2016-09-02 NOTE — ED Notes (Signed)
Patient being hostile with staff. For example, patient asking, "Why are y'all asking me all these questions?! I am feeling bad and y'all just keep asking questions."

## 2016-09-02 NOTE — ED Notes (Signed)
Patient transported to X-ray 

## 2016-09-02 NOTE — ED Provider Notes (Signed)
[facility] DEPT Provider Note   CSN: [id number] Arrival date & time: 09/02/16  0745     History   Chief Complaint Chief Complaint  Patient presents with  . Chest Pain  . Hematemesis  . Diarrhea    HPI Antonio Johnson is a 55 y.o. male.  HPI Antonio Johnson is a 55 y.o. male with no medical problems, presents to ED with complaints of nausea, vomiting, diarrhea, chest pain, dizziness. Patient reports that he has had left-sided chest pain since yesterday. Pain has been constant. Radiates into the left arm. Nothing is making his pain better or worse. Patient also reports nausea, vomiting, diarrhea since midnight this morning. He reports greater than 10 episodes of diarrhea. He states he is able to keep fluids down. He reports diffuse abdominal pain. No fever or chills. No prior abdominal surgeries. Patient states that he did have one episode of emesis with bright red blood in it. States was a small amount. Denies any other abdominal issues. Does not drink alcohol. Does not take any medications. Does not have a PCP.  History reviewed. No pertinent past medical history.  There are no active problems to display for this patient.   History reviewed. No pertinent surgical history.     Home Medications    Prior to Admission medications   Medication Sig Start Date End Date Taking? Authorizing Provider  dicyclomine (BENTYL) 20 MG tablet Take 1 tablet (20 mg total) by mouth 2 (two) times daily. 04/01/16   Waynetta Pean, PA-C  Multiple Vitamin (MULITIVITAMIN WITH MINERALS) TABS Take 1 tablet by mouth daily.    Historical Provider, MD  ondansetron (ZOFRAN ODT) 4 MG disintegrating tablet Take 1 tablet (4 mg total) by mouth every 8 (eight) hours as needed for nausea or vomiting. 04/01/16   Waynetta Pean, PA-C    Family History History reviewed. No pertinent family history.  Social History Social History  Substance Use Topics  . Smoking status: Never Smoker  . Smokeless tobacco: Never  Used  . Alcohol use No     Allergies   Review of patient's allergies indicates no known allergies.   Review of Systems Review of Systems  Constitutional: Negative for chills and fever.  Respiratory: Positive for chest tightness. Negative for cough and shortness of breath.   Cardiovascular: Positive for chest pain. Negative for palpitations and leg swelling.  Gastrointestinal: Positive for abdominal pain, nausea and vomiting. Negative for abdominal distention and diarrhea.  Genitourinary: Negative for dysuria, frequency, hematuria and urgency.  Musculoskeletal: Negative for arthralgias, myalgias, neck pain and neck stiffness.  Skin: Negative for rash.  Allergic/Immunologic: Negative for immunocompromised state.  Neurological: Positive for dizziness and light-headedness. Negative for weakness, numbness and headaches.  All other systems reviewed and are negative.    Physical Exam Updated Vital Signs BP 162/96   Pulse 84   Temp 98.5 F (36.9 C) (Oral)   Resp 20   Ht 6\' 5"  (1.956 m)   Wt 117.9 kg   SpO2 99%   BMI 30.83 kg/m   Physical Exam  Constitutional: He appears well-developed and well-nourished. No distress.  HENT:  Head: Normocephalic and atraumatic.  Eyes: Conjunctivae are normal.  Neck: Neck supple.  Cardiovascular: Normal rate, regular rhythm and normal heart sounds.   Pulmonary/Chest: Effort normal. No respiratory distress. He has no wheezes. He has no rales.  Abdominal: Soft. Bowel sounds are normal. He exhibits no distension. There is tenderness. There is no rebound.  Epigastric tenderness  Musculoskeletal: He exhibits  no edema.  Neurological: He is alert.  Skin: Skin is warm and dry.  Nursing note and vitals reviewed.    ED Treatments / Results  Labs (all labs ordered are listed, but only abnormal results are displayed) Labs Reviewed  CBC - Abnormal; Notable for the following:       Result Value   Hemoglobin 12.6 (*)    HCT 37.1 (*)    All other  components within normal limits  URINALYSIS, ROUTINE W REFLEX MICROSCOPIC (NOT AT [facility]) - Abnormal; Notable for the following:    Glucose, UA 250 (*)    Hgb urine dipstick TRACE (*)    Protein, ur >300 (*)    All other components within normal limits  URINE MICROSCOPIC-ADD ON - Abnormal; Notable for the following:    Squamous Epithelial / LPF 0-5 (*)    Bacteria, UA FEW (*)    All other components within normal limits  LIPASE, BLOOD  COMPREHENSIVE METABOLIC PANEL  I-STAT TROPOININ, ED    EKG  EKG Interpretation  Date/Time:  Tuesday September 02 2016 07:51:54 EDT Ventricular Rate:  90 PR Interval:    QRS Duration: 81 QT Interval:  378 QTC Calculation: 463 R Axis:   63 Text Interpretation:  Sinus rhythm Normal ECG No significant change since last tracing Confirmed by KNOTT MD, DANIEL [id number]) on 09/02/2016 8:03:43 AM       Radiology No results found.  Procedures Procedures (including critical care time)  Medications Ordered in ED Medications  sodium chloride 0.9 % bolus 1,000 mL (not administered)    And  0.9 %  sodium chloride infusion (not administered)  pantoprazole (PROTONIX) injection 40 mg (not administered)  ondansetron (ZOFRAN) injection 4 mg (not administered)     Initial Impression / Assessment and Plan / ED Course  I have reviewed the triage vital signs and the nursing notes.  Pertinent labs & imaging results that were available during my care of the patient were reviewed by me and considered in my medical decision making (see chart for details).  Clinical Course   Patient emergency department with left-sided chest pain that has been constant since yesterday. He is also complaining of diffuse abdominal pain with nausea, vomiting, diarrhea. No medical problems, does not take any medications. Heart score of 3. EKG normal. Will check labs including troponin, imaging/plain x-ray of the chest and abdomen, will order Protonix for hemodialysis and Zofran for nausea  and vomiting. Will administer fluids. Patient is in no acute distress. Hypertensive, normal heart rate, normal respiratory rate.  10:21 AM Labs show elevated blood sugar of 250. Patient states that he knows he has diabetes, but he chooses not to take any medications for it. He refused me starting him on anything. He states he will control it better with diet and exercise. Blood blood cell count is normal. Patient feels much better after Zofran and Protonix and fluids. He was to be discharged home. Advised to stay for by mouth trial and repeat troponin, however patient refused. Patient's hemoglobin is 12.6, vital signs are not concerning, will discharge AMA. I did give a prescription for Zofran and Pepcid. Patient requesting work note.  Vitals:   09/02/16 0756 09/02/16 0814 09/02/16 0825 09/02/16 1000  BP: 171/100 162/96 154/99 147/91  Pulse: 86 84 79 65  Resp: 24 20 18 18   Temp: 98.5 F (36.9 C)     TempSrc: Oral     SpO2: 94% 99% 95% 99%  Weight:      Height:  Final Clinical Impressions(s) / ED Diagnoses   Final diagnoses:  Chest pain, unspecified type  Abdominal pain, unspecified abdominal location  Nausea vomiting and diarrhea  Hyperglycemia  Elevated blood pressure reading    New Prescriptions New Prescriptions   FAMOTIDINE (PEPCID) 20 MG TABLET    Take 1 tablet (20 mg total) by mouth 2 (two) times daily.   ONDANSETRON (ZOFRAN ODT) 8 MG DISINTEGRATING TABLET    Take 1 tablet (8 mg total) by mouth every 8 (eight) hours as needed for nausea or vomiting.     Jeannett Senior, PA-C 09/02/16 [place], MD 09/02/16 2004

## 2016-09-03 ENCOUNTER — Ambulatory Visit (INDEPENDENT_AMBULATORY_CARE_PROVIDER_SITE_OTHER): Payer: Commercial Managed Care - HMO | Admitting: Physician Assistant

## 2016-09-03 VITALS — BP 132/88 | HR 85 | Temp 98.3°F | Resp 17 | Ht 77.0 in | Wt 264.0 lb

## 2016-09-03 DIAGNOSIS — R42 Dizziness and giddiness: Secondary | ICD-10-CM | POA: Diagnosis not present

## 2016-09-03 DIAGNOSIS — R197 Diarrhea, unspecified: Secondary | ICD-10-CM

## 2016-09-03 DIAGNOSIS — Z8639 Personal history of other endocrine, nutritional and metabolic disease: Secondary | ICD-10-CM | POA: Diagnosis not present

## 2016-09-03 DIAGNOSIS — R079 Chest pain, unspecified: Secondary | ICD-10-CM

## 2016-09-03 DIAGNOSIS — E118 Type 2 diabetes mellitus with unspecified complications: Secondary | ICD-10-CM

## 2016-09-03 DIAGNOSIS — R112 Nausea with vomiting, unspecified: Secondary | ICD-10-CM

## 2016-09-03 LAB — TROPONIN I: Troponin I: 0.01 ng/mL (ref <=0.05)

## 2016-09-03 LAB — POCT CBC
GRANULOCYTE PERCENT: 67.1 % (ref 37–80)
HEMATOCRIT: 36.6 % — AB (ref 43.5–53.7)
Hemoglobin: 12.9 g/dL — AB (ref 14.1–18.1)
LYMPH, POC: 1.6 (ref 0.6–3.4)
MCH, POC: 29.3 pg (ref 27–31.2)
MCHC: 35.2 g/dL (ref 31.8–35.4)
MCV: 83.3 fL (ref 80–97)
MID (CBC): 0.2 (ref 0–0.9)
MPV: 7.8 fL (ref 0–99.8)
POC GRANULOCYTE: 3.7 (ref 2–6.9)
POC LYMPH %: 29 % (ref 10–50)
POC MID %: 3.9 %M (ref 0–12)
Platelet Count, POC: 215 10*3/uL (ref 142–424)
RBC: 4.39 M/uL — AB (ref 4.69–6.13)
RDW, POC: 13.7 %
WBC: 5.5 10*3/uL (ref 4.6–10.2)

## 2016-09-03 LAB — POC MICROSCOPIC URINALYSIS (UMFC): Mucus: ABSENT

## 2016-09-03 LAB — POCT URINALYSIS DIP (MANUAL ENTRY)
Bilirubin, UA: NEGATIVE
Glucose, UA: NEGATIVE
Ketones, POC UA: NEGATIVE
LEUKOCYTES UA: NEGATIVE
NITRITE UA: NEGATIVE
PH UA: 5.5
Spec Grav, UA: 1.025
UROBILINOGEN UA: 0.2

## 2016-09-03 LAB — POCT GLYCOSYLATED HEMOGLOBIN (HGB A1C): Hemoglobin A1C: 9.7

## 2016-09-03 MED ORDER — LOPERAMIDE HCL 2 MG PO CAPS: 2.0000 mg | ORAL_CAPSULE | Freq: Two times a day (BID) | ORAL | 0 refills | Status: DC | PRN

## 2016-09-03 MED ORDER — GI COCKTAIL ~~LOC~~
30.0000 mL | Freq: Once | ORAL | Status: AC
  Administered 2016-09-03: 30 mL via ORAL

## 2016-09-03 NOTE — Progress Notes (Signed)
Antonio Johnson  MRN: [id number] DOB: 09-Mar-1961  Subjective:  Antonio Johnson is a 55 y.o. male seen in office today for a chief complaint of vomiting x 4 episodes, diarrhea x all day (~9 episodes), dizziness, and headache x 1 day. Has associated abdominal discomfort, hematemesis, SOB, sharp chest discomfort exacerbated by movement that radiates to his left arm. Denies new food exposure, hematochezia, and cough. He has tried motrin with no relief. He does have a coworker that was sick as well and had to leave work the same type of thing. He was seen in the ER last night for these exact complaints and lab work showed glucose of 265, proteinuria, hgb of 12.6, negative lipase, negative troponoin, normal ekg, and normal cxr. Pt given IV fluids, protonix, and zofran. He then left AMA, despite the ER recommending he have serial troponins collected. He also refused treatment for high blood sugars because he states that was the first time he had ever been told he had high blood sugar and he would like to try diet and exercise to treat it. He notes he feels worse today.  Notes he has been able to drink one gallon of water today.   Review of Systems  Constitutional: Positive for chills, diaphoresis and fatigue. Negative for fever.  HENT: Positive for tinnitus.   Respiratory: Negative for cough.   Cardiovascular: Negative for palpitations.  Endocrine: Positive for polydipsia, polyphagia and polyuria.  Genitourinary: Negative for hematuria.  Neurological: Positive for numbness (in bilateral feet). Negative for facial asymmetry, speech difficulty and weakness.    There are no active problems to display for this patient.   Current Outpatient Prescriptions on File Prior to Visit  Medication Sig Dispense Refill  . dicyclomine (BENTYL) 20 MG tablet Take 1 tablet (20 mg total) by mouth 2 (two) times daily. (Patient not taking: Reported on 09/03/2016) 20 tablet 0  . famotidine (PEPCID) 20 MG tablet Take 1  tablet (20 mg total) by mouth 2 (two) times daily. (Patient not taking: Reported on 09/03/2016) 30 tablet 0  . Multiple Vitamin (MULITIVITAMIN WITH MINERALS) TABS Take 1 tablet by mouth daily.    . ondansetron (ZOFRAN ODT) 4 MG disintegrating tablet Take 1 tablet (4 mg total) by mouth every 8 (eight) hours as needed for nausea or vomiting. (Patient not taking: Reported on 09/03/2016) 10 tablet 0  . ondansetron (ZOFRAN ODT) 8 MG disintegrating tablet Take 1 tablet (8 mg total) by mouth every 8 (eight) hours as needed for nausea or vomiting. (Patient not taking: Reported on 09/03/2016) 10 tablet 0   No current facility-administered medications on file prior to visit.     No Known Allergies   Pt has extensive FH of heart disease and diabetes in father, mother, sister, uncle, and cousin. Pt does not smoke or drink alcohol.   Objective:  BP 132/88 (BP Location: Right Arm, Patient Position: Sitting, Cuff Size: Large)   Pulse 85   Temp 98.3 F (36.8 C) (Oral)   Resp 17   Ht 6\' 5"  (1.956 m)   Wt 264 lb (119.7 kg)   SpO2 97%   BMI 31.31 kg/m   Physical Exam  Constitutional: He is oriented to person, place, and time and well-developed, well-nourished, and in no distress.  HENT:  Head: Normocephalic and atraumatic.  Eyes: EOM are normal. Right conjunctiva is injected. Left conjunctiva is injected.  Neck: Normal range of motion.  Cardiovascular: Normal rate, regular rhythm and normal pulses.   Pulmonary/Chest: Effort normal.  Abdominal: Soft. Normal appearance and bowel sounds are normal. There is no tenderness.  Neurological: He is alert and oriented to person, place, and time. He has normal motor skills, normal sensation, normal strength and intact cranial nerves. He displays normal speech. No sensory deficit. He has a normal Cerebellar Exam, a normal Finger-Nose-Finger Test, a normal Heel to [employer] and a normal Romberg Test. He shows no pronator drift. Gait normal.  Reflex Scores:       Patellar reflexes are 2+ on the right side and 2+ on the left side. Skin: Skin is warm and dry.  Psychiatric: Mood and affect normal.  Vitals reviewed.  EKG shows NSR at 75 bpm with no acute ST or T wave changes compared to prior EKG on 09/02/16. It does appear that leads v1 and v2 were flipped compared to yesterday's EKG. EKG findings were discussed with Dr. Mingo Amber.   Results for orders placed or performed in visit on 09/03/16 (from the past 24 hour(s))  POCT Microscopic Urinalysis ([facility])     Status: None   Collection Time: 09/03/16  7:07 PM  Result Value Ref Range   WBC,UR,HPF,POC None None WBC/hpf   RBC,UR,HPF,POC None None RBC/hpf   Bacteria None None, Too numerous to count   Mucus Absent Absent   Epithelial Cells, UR Per Microscopy None None, Too numerous to count cells/hpf  POCT CBC     Status: Abnormal   Collection Time: 09/03/16  7:07 PM  Result Value Ref Range   WBC 5.5 4.6 - 10.2 K/uL   Lymph, poc 1.6 0.6 - 3.4   POC LYMPH PERCENT 29.0 10 - 50 %L   MID (cbc) 0.2 0 - 0.9   POC MID % 3.9 0 - 12 %M   POC Granulocyte 3.7 2 - 6.9   Granulocyte percent 67.1 37 - 80 %G   RBC 4.39 (A) 4.69 - 6.13 M/uL   Hemoglobin 12.9 (A) 14.1 - 18.1 g/dL   HCT, POC 36.6 (A) 43.5 - 53.7 %   MCV 83.3 80 - 97 fL   MCH, POC 29.3 27 - 31.2 pg   MCHC 35.2 31.8 - 35.4 g/dL   RDW, POC 13.7 %   Platelet Count, POC 215 142 - 424 K/uL   MPV 7.8 0 - 99.8 fL  POCT glycosylated hemoglobin (Hb A1C)     Status: None   Collection Time: 09/03/16  7:07 PM  Result Value Ref Range   Hemoglobin A1C 9.7   POCT urinalysis dipstick     Status: Abnormal   Collection Time: 09/03/16  7:08 PM  Result Value Ref Range   Color, UA yellow yellow   Clarity, UA clear clear   Glucose, UA negative negative   Bilirubin, UA negative negative   Ketones, POC UA negative negative   Spec Grav, UA 1.025    Blood, UA trace-intact (A) negative   pH, UA 5.5    Protein Ur, POC =100 (A) negative   Urobilinogen, UA 0.2     Nitrite, UA Negative Negative   Leukocytes, UA Negative Negative   Orthostatic VS for the past 24 hrs:  BP- Lying Pulse- Lying BP- Sitting Pulse- Sitting BP- Standing at 0 minutes Pulse- Standing at 0 minutes  09/03/16 1916 141/81 67 151/80 69 120/72 79     Assessment and Plan :  This case was precepted with Dr. Tamala Julian  1. Chest pain, unspecified type - EKG 12-Lead - Troponin I STAT  2. Nausea and vomiting, intractability of vomiting  not specified, unspecified vomiting type - POCT CBC - COMPLETE METABOLIC PANEL WITH GFR - gi cocktail (Maalox,Lidocaine,Donnatal); Take 30 mLs by mouth once.  3. History of elevated glucose - POCT Microscopic Urinalysis ([facility]) - POCT urinalysis dipstick - POCT glycosylated hemoglobin (Hb A1C) - COMPLETE METABOLIC PANEL WITH GFR  4. Dizziness - Orthostatic vital signs  5. Diarrhea, unspecified type - loperamide (IMODIUM) 2 MG capsule; Take 1 capsule (2 mg total) by mouth 2 (two) times daily as needed for diarrhea or loose stools.  Dispense: 30 capsule; Refill: 0 -Encouraged proper hydration and BRAT diet, advance diet as tolerated   6. Type 2 diabetes mellitus with complication, without long-term current use of insulin (HCC) -Pt to follow up after he recovers from GI upset so we can initiate diabetes medication and do appropriate diabetes management. Pt agrees to follow up.   -Return to clinic sooner if symptoms worsen or do not improve  Tenna Delaine PA-C  [facility] 09/03/2016 7:23 PM

## 2016-09-03 NOTE — Patient Instructions (Addendum)
   Take imodium once in the morning and once at night for diarrhea. You can pick up the zofran to use for nausea and vomiting.   Continue to drink lots of fluids and eat a bland diet.   Follow up with me in one week for diabetes management.  You can call our office to make an appointment with me.   -Return to clinic if symptoms worsen, do not improve, or as needed   IF you received an x-ray today, you will receive an invoice from [facility]. Please contact [facility] at [phone] with questions or concerns regarding your invoice.   IF you received labwork today, you will receive an invoice from [employer]. Please contact [facility] at [phone] with questions or concerns regarding your invoice.   Our billing staff will not be able to assist you with questions regarding bills from these companies.  You will be contacted with the lab results as soon as they are available. The fastest way to get your results is to activate your My Chart account. Instructions are located on the last page of this paperwork. If you have not heard from [place] regarding the results in 2 weeks, please contact this office.

## 2016-09-04 LAB — COMPLETE METABOLIC PANEL WITH GFR
ALBUMIN: 4.4 g/dL (ref 3.6–5.1)
ALK PHOS: 57 U/L (ref 40–115)
ALT: 26 U/L (ref 9–46)
AST: 30 U/L (ref 10–35)
BILIRUBIN TOTAL: 0.4 mg/dL (ref 0.2–1.2)
BUN: 19 mg/dL (ref 7–25)
CALCIUM: 9.5 mg/dL (ref 8.6–10.3)
CO2: 25 mmol/L (ref 20–31)
Chloride: 102 mmol/L (ref 98–110)
Creat: 1.34 mg/dL — ABNORMAL HIGH (ref 0.70–1.33)
GFR, EST AFRICAN AMERICAN: 69 mL/min (ref >=60)
GFR, EST NON AFRICAN AMERICAN: 60 mL/min (ref >=60)
GLUCOSE: 154 mg/dL — AB (ref 65–99)
Potassium: 3.6 mmol/L (ref 3.5–5.3)
Sodium: 139 mmol/L (ref 135–146)
TOTAL PROTEIN: 7.6 g/dL (ref 6.1–8.1)

## 2018-04-22 ENCOUNTER — Other Ambulatory Visit: Payer: Self-pay

## 2018-04-22 ENCOUNTER — Emergency Department (HOSPITAL_COMMUNITY)
Admission: EM | Admit: 2018-04-22 | Discharge: 2018-04-22 | Disposition: A | Discharge status: Home / Self Care | Payer: 59 | Attending: Emergency Medicine | Admitting: Emergency Medicine

## 2018-04-22 ENCOUNTER — Emergency Department (HOSPITAL_COMMUNITY): Payer: 59

## 2018-04-22 ENCOUNTER — Encounter (HOSPITAL_COMMUNITY): Payer: Self-pay | Admitting: Emergency Medicine

## 2018-04-22 DIAGNOSIS — M25461 Effusion, right knee: Secondary | ICD-10-CM | POA: Diagnosis not present

## 2018-04-22 DIAGNOSIS — Y999 Unspecified external cause status: Secondary | ICD-10-CM | POA: Diagnosis not present

## 2018-04-22 DIAGNOSIS — S8391XA Sprain of unspecified site of right knee, initial encounter: Secondary | ICD-10-CM | POA: Diagnosis not present

## 2018-04-22 DIAGNOSIS — Y929 Unspecified place or not applicable: Secondary | ICD-10-CM | POA: Insufficient documentation

## 2018-04-22 DIAGNOSIS — S8991XA Unspecified injury of right lower leg, initial encounter: Secondary | ICD-10-CM | POA: Diagnosis not present

## 2018-04-22 DIAGNOSIS — X500XXA Overexertion from strenuous movement or load, initial encounter: Secondary | ICD-10-CM | POA: Insufficient documentation

## 2018-04-22 DIAGNOSIS — Y9389 Activity, other specified: Secondary | ICD-10-CM | POA: Diagnosis not present

## 2018-04-22 MED ORDER — KETOROLAC TROMETHAMINE 60 MG/2ML IM SOLN
60.0000 mg | Freq: Once | INTRAMUSCULAR | Status: AC
  Administered 2018-04-22: 60 mg via INTRAMUSCULAR
  Filled 2018-04-22: qty 2

## 2018-04-22 MED ORDER — HYDROCODONE-ACETAMINOPHEN 5-325 MG PO TABS: 1.0000 | ORAL_TABLET | Freq: Four times a day (QID) | ORAL | 0 refills | Status: DC | PRN

## 2018-04-22 NOTE — Discharge Instructions (Addendum)
Follow-up with an Orthopedist in 1 week for follow-up. You may ultimately need surgery if your ligament does not heal.

## 2018-04-22 NOTE — ED Triage Notes (Signed)
Pt complaint of right knee pain post turning it getting out of truck yesterday.

## 2018-04-22 NOTE — ED Provider Notes (Signed)
[facility] DEPT Provider Note   CSN: [id number] Arrival date & time: 04/22/18  1025     History   Chief Complaint Chief Complaint  Patient presents with  . Knee Pain    HPI Antonio Johnson is a 57 y.o. male.  HPI   57 year old male here with right knee pain.  Patient was getting into his truck yesterday when his knee turned inward and he felt a pop.  He then fell to the ground.  Denies any head injury or other injuries from the fall.  Reports that since then, he is noticed instability and clicking in his right knee whenever he tries to walk.  He was seen by the nurse at his work and was given Tylenol and ibuprofen, which has not significantly improved his pain.  The pain is aching, throbbing, worse with weightbearing and any kind of movement.  No alleviating factors.  No lower extremity weakness or numbness.  No history of previous injuries to the knee.  History reviewed. No pertinent past medical history.  There are no active problems to display for this patient.   History reviewed. No pertinent surgical history.      Home Medications    Prior to Admission medications   Medication Sig Start Date End Date Taking? Authorizing Provider  dicyclomine (BENTYL) 20 MG tablet Take 1 tablet (20 mg total) by mouth 2 (two) times daily. Patient not taking: Reported on 09/03/2016 04/01/16   Waynetta Pean, PA-C  famotidine (PEPCID) 20 MG tablet Take 1 tablet (20 mg total) by mouth 2 (two) times daily. Patient not taking: Reported on 09/03/2016 09/02/16   Jeannett Senior, PA-C  HYDROcodone-acetaminophen (NORCO/VICODIN) 5-325 MG tablet Take 1-2 tablets by mouth every 6 (six) hours as needed for moderate pain or severe pain. 04/22/18   Duffy Bruce, MD  loperamide (IMODIUM) 2 MG capsule Take 1 capsule (2 mg total) by mouth 2 (two) times daily as needed for diarrhea or loose stools. 09/03/16   Tenna Delaine D, PA-C  Multiple Vitamin (MULITIVITAMIN WITH  MINERALS) TABS Take 1 tablet by mouth daily.    [provider]  ondansetron (ZOFRAN ODT) 4 MG disintegrating tablet Take 1 tablet (4 mg total) by mouth every 8 (eight) hours as needed for nausea or vomiting. Patient not taking: Reported on 09/03/2016 04/01/16   Waynetta Pean, PA-C  ondansetron (ZOFRAN ODT) 8 MG disintegrating tablet Take 1 tablet (8 mg total) by mouth every 8 (eight) hours as needed for nausea or vomiting. Patient not taking: Reported on 09/03/2016 09/02/16   Jeannett Senior, PA-C    Family History No family history on file.  Social History Social History   Tobacco Use  . Smoking status: Never Smoker  . Smokeless tobacco: Never Used  Substance Use Topics  . Alcohol use: No  . Drug use: No     Allergies   Patient has no known allergies.   Review of Systems Review of Systems  Constitutional: Negative for chills and fever.  Respiratory: Negative for shortness of breath.   Cardiovascular: Negative for chest pain.  Musculoskeletal: Positive for arthralgias and gait problem. Negative for neck pain.  Skin: Negative for rash and wound.  Allergic/Immunologic: Negative for immunocompromised state.  Neurological: Negative for weakness and numbness.  Hematological: Does not bruise/bleed easily.     Physical Exam Updated Vital Signs BP (!) 159/86 (BP Location: Right Arm)   Pulse 79   Temp 98.1 F (36.7 C) (Oral)   Resp 18  SpO2 96%   Physical Exam  Constitutional: He is oriented to person, place, and time. He appears well-developed and well-nourished. No distress.  HENT:  Head: Normocephalic and atraumatic.  Eyes: Conjunctivae are normal.  Neck: Neck supple.  Cardiovascular: Normal rate, regular rhythm and normal heart sounds.  Pulmonary/Chest: Effort normal. No respiratory distress. He has no wheezes.  Abdominal: He exhibits no distension.  Musculoskeletal: He exhibits no edema.  Neurological: He is alert and oriented to person, place,  and time. He exhibits normal muscle tone.  Skin: Skin is warm. Capillary refill takes less than 2 seconds. No rash noted.  Nursing note and vitals reviewed.   LOWER EXTREMITY EXAM: RIGHT  INSPECTION & PALPATION: Moderate tenderness over the medial aspect of the knee and medial posterior joint line.  No palpable or audible clicking on passive range of motion, but there is some ligamentous laxity on posterior drawer testing and stress of the medial joint line.  SENSORY: sensation is intact to light touch in:  Superficial peroneal nerve distribution (over dorsum of foot) Deep peroneal nerve distribution (over first dorsal web space) Sural nerve distribution (over lateral aspect 5th metatarsal) Saphenous nerve distribution (over medial instep)  MOTOR:  + Motor EHL (great toe dorsiflexion) + FHL (great toe plantar flexion)  + TA (ankle dorsiflexion)  + GSC (ankle plantar flexion)  VASCULAR: 2+ dorsalis pedis and posterior tibialis pulses Capillary refill < 2 sec, toes warm and well-perfused  COMPARTMENTS: Soft, warm, well-perfused No pain with passive extension No parethesias    ED Treatments / Results  Labs (all labs ordered are listed, but only abnormal results are displayed) Labs Reviewed - No data to display  EKG None  Radiology Dg Knee Complete 4 Views Right  Result Date: 04/22/2018 CLINICAL DATA:  Twisting right knee injury during a fall yesterday. Posterior knee pain. EXAM: RIGHT KNEE - COMPLETE 4+ VIEW COMPARISON:  None. FINDINGS: Tricompartmental spurring. Moderate to large knee effusion in the suprapatellar bursa. There is spurring of the tibial spine. Irregularity along the rim of the tibial plateau is thought to primarily be due to spurring; I do not see a well-defined fracture. There is also spurring at the quadriceps attachment site on the patella. IMPRESSION: 1. Moderate to large knee effusion. This could be an indicator of internal derangement. If pain persists  despite conservative therapy, MRI may be warranted for further characterization. 2. No definite fracture identified. 3. Tricompartmental spurring. Electronically Signed   By: Van Clines M.D.   On: 04/22/2018 08:49    Procedures Procedures (including critical care time)  Medications Ordered in ED Medications  ketorolac (TORADOL) injection 60 mg (60 mg Intramuscular Given 04/22/18 0830)     Initial Impression / Assessment and Plan / ED Course  I have reviewed the triage vital signs and the nursing notes.  Pertinent labs & imaging results that were available during my care of the patient were reviewed by me and considered in my medical decision making (see chart for details).     57 yo M here w/ R knee pain after inversion/twisting injury. Clinically, concern for MCL/PCL injury. No apparent meniscal injury but this is also a consideration. XR neg for fx but does show mod to large knee effusion. This all began post trauma and do not suspect infection, arthritis. Will tx with knee immobilizer, crutches, outpt follow-up. Importance of f/u reiterated.  Final Clinical Impressions(s) / ED Diagnoses   Final diagnoses:  Sprain of right knee, unspecified ligament, initial encounter  ED Discharge Orders        Ordered    HYDROcodone-acetaminophen (NORCO/VICODIN) 5-325 MG tablet  Every 6 hours PRN     04/22/18 0846       Duffy Bruce, MD 04/22/18 [phone]

## 2018-05-05 DIAGNOSIS — M25561 Pain in right knee: Secondary | ICD-10-CM | POA: Insufficient documentation

## 2018-05-05 DIAGNOSIS — M1711 Unilateral primary osteoarthritis, right knee: Secondary | ICD-10-CM | POA: Insufficient documentation

## 2018-05-05 DIAGNOSIS — M2391 Unspecified internal derangement of right knee: Secondary | ICD-10-CM | POA: Diagnosis not present

## 2018-05-12 DIAGNOSIS — M25561 Pain in right knee: Secondary | ICD-10-CM | POA: Diagnosis not present

## 2018-06-25 DIAGNOSIS — M25561 Pain in right knee: Secondary | ICD-10-CM | POA: Diagnosis not present

## 2018-06-25 DIAGNOSIS — M1711 Unilateral primary osteoarthritis, right knee: Secondary | ICD-10-CM | POA: Diagnosis not present

## 2018-07-30 ENCOUNTER — Emergency Department (HOSPITAL_BASED_OUTPATIENT_CLINIC_OR_DEPARTMENT_OTHER): Payer: 59

## 2018-07-30 ENCOUNTER — Other Ambulatory Visit: Payer: Self-pay

## 2018-07-30 ENCOUNTER — Emergency Department (HOSPITAL_COMMUNITY)
Admission: EM | Admit: 2018-07-30 | Discharge: 2018-07-30 | Disposition: A | Discharge status: Home / Self Care | Payer: 59 | Attending: Emergency Medicine | Admitting: Emergency Medicine

## 2018-07-30 DIAGNOSIS — Z79899 Other long term (current) drug therapy: Secondary | ICD-10-CM | POA: Insufficient documentation

## 2018-07-30 DIAGNOSIS — R600 Localized edema: Secondary | ICD-10-CM | POA: Diagnosis not present

## 2018-07-30 DIAGNOSIS — M79609 Pain in unspecified limb: Secondary | ICD-10-CM | POA: Diagnosis not present

## 2018-07-30 DIAGNOSIS — M7989 Other specified soft tissue disorders: Secondary | ICD-10-CM | POA: Diagnosis not present

## 2018-07-30 DIAGNOSIS — R2241 Localized swelling, mass and lump, right lower limb: Secondary | ICD-10-CM | POA: Diagnosis present

## 2018-07-30 DIAGNOSIS — I82411 Acute embolism and thrombosis of right femoral vein: Secondary | ICD-10-CM | POA: Insufficient documentation

## 2018-07-30 LAB — COMPREHENSIVE METABOLIC PANEL
ALBUMIN: 4.5 g/dL (ref 3.5–5.0)
ALK PHOS: 62 U/L (ref 38–126)
ALT: 33 U/L (ref 0–44)
AST: 30 U/L (ref 15–41)
Anion gap: 11 (ref 5–15)
BILIRUBIN TOTAL: 0.9 mg/dL (ref 0.3–1.2)
BUN: 17 mg/dL (ref 6–20)
CHLORIDE: 106 mmol/L (ref 98–111)
CO2: 25 mmol/L (ref 22–32)
Calcium: 10.1 mg/dL (ref 8.9–10.3)
Creatinine, Ser: 1 mg/dL (ref 0.61–1.24)
GFR calc Af Amer: >60 mL/min (ref >60)
Glucose, Bld: 250 mg/dL — ABNORMAL HIGH (ref 70–99)
POTASSIUM: 3.8 mmol/L (ref 3.5–5.1)
Sodium: 142 mmol/L (ref 135–145)
Total Protein: 8.4 g/dL — ABNORMAL HIGH (ref 6.5–8.1)

## 2018-07-30 LAB — CBC WITH DIFFERENTIAL/PLATELET
BASOS ABS: 0 10*3/uL (ref 0.0–0.1)
BASOS PCT: 0 %
Eosinophils Absolute: 0 10*3/uL (ref 0.0–0.7)
Eosinophils Relative: 1 %
HEMATOCRIT: 38.5 % — AB (ref 39.0–52.0)
Hemoglobin: 13.3 g/dL (ref 13.0–17.0)
LYMPHS PCT: 30 %
Lymphs Abs: 1.2 10*3/uL (ref 0.7–4.0)
MCH: 28.6 pg (ref 26.0–34.0)
MCHC: 34.5 g/dL (ref 30.0–36.0)
MCV: 82.8 fL (ref 78.0–100.0)
MONO ABS: 0.2 10*3/uL (ref 0.1–1.0)
Monocytes Relative: 6 %
NEUTROS ABS: 2.4 10*3/uL (ref 1.7–7.7)
Neutrophils Relative %: 63 %
Platelets: 218 10*3/uL (ref 150–400)
RBC: 4.65 MIL/uL (ref 4.22–5.81)
RDW: 13.3 % (ref 11.5–15.5)
WBC: 3.8 10*3/uL — ABNORMAL LOW (ref 4.0–10.5)

## 2018-07-30 LAB — BRAIN NATRIURETIC PEPTIDE: B NATRIURETIC PEPTIDE 5: 22.9 pg/mL (ref 0.0–100.0)

## 2018-07-30 MED ORDER — RIVAROXABAN (XARELTO) VTE STARTER PACK (15 & 20 MG): ORAL_TABLET | ORAL | 0 refills | Status: DC

## 2018-07-30 MED ORDER — RIVAROXABAN 15 MG PO TABS
15.0000 mg | ORAL_TABLET | Freq: Once | ORAL | Status: AC
  Administered 2018-07-30: 15 mg via ORAL
  Filled 2018-07-30: qty 1

## 2018-07-30 MED ORDER — DICLOFENAC SODIUM 1 % TD GEL: 2.0000 g | Freq: Three times a day (TID) | TRANSDERMAL | 0 refills | Status: DC | PRN

## 2018-07-30 MED ORDER — RIVAROXABAN (XARELTO) EDUCATION KIT FOR DVT/PE PATIENTS
PACK | Freq: Once | Status: DC
  Filled 2018-07-30: qty 1

## 2018-07-30 NOTE — Care Management Note (Signed)
Case Management Note  CM consulted for no pcp.  Spoke with pt at bedside advising him to call [facility] for an in-network provider. He requested several MD offices in this area and said he would just call them directly to see if they accept his ins.  CM discussed [facility] locations and [facility] locations.  CM placed reminders on AVS. Pt reports he has access to the internet and can find the ones closest to his house/work. Updated Dr. Ellender Hose.  No further CM needs noted at this time.  [name], [name], RN 07/30/2018, 12:04 PM

## 2018-07-30 NOTE — Discharge Instructions (Addendum)
STOP taking your Relafen. Instead, use tylenol 500 to 1000 mg every 6 hours as needed. I have also prescribed a gel, Voltaren, to apply to your knee if it hurts.  Follow-up with your doctor this week.  I recommend wearing COMPRESSION STOCKINGS daily to help prevent further clots and swelling.   Information on my medicine - XARELTO (rivaroxaban)  This medication education was reviewed with me or my healthcare representative as part of my discharge preparation.  The pharmacist that spoke with me during my hospital stay was:  WOFFORD, DREW A, [place]? Xarelto was prescribed to treat blood clots that may have been found in the veins of your legs (deep vein thrombosis) or in your lungs (pulmonary embolism) and to reduce the risk of them occurring again.  WHAT DO YOU NEED TO KNOW ABOUT XARELTO? The starting dose is one 15 mg tablet taken TWICE daily with food for the FIRST 21 DAYS then on (enter date)  08/20/18  the dose is changed to one 20 mg tablet taken ONCE A DAY with your evening meal.  DO NOT stop taking Xarelto without talking to the health care provider who prescribed the medication.  Refill your prescription for 20 mg tablets before you run out.  After discharge, you should have regular check-up appointments with your healthcare provider that is prescribing your Xarelto.  In the future your dose may need to be changed if your kidney function changes by a significant amount.  WHAT DO YOU DO IF YOU MISS A DOSE? If you are taking Xarelto TWICE DAILY and you miss a dose, take it as soon as you remember. You may take two 15 mg tablets (total 30 mg) at the same time then resume your regularly scheduled 15 mg twice daily the next day.  If you are taking Xarelto ONCE DAILY and you miss a dose, take it as soon as you remember on the same day then continue your regularly scheduled once daily regimen the next day. Do not take two doses of Xarelto at the same  time.   IMPORTANT SAFETY INFORMATION Xarelto is a blood thinner medicine that can cause bleeding. You should call your healthcare provider right away if you experience any of the following: Bleeding from an injury or your nose that does not stop. Unusual colored urine (red or dark brown) or unusual colored stools (red or black). Unusual bruising for unknown reasons. A serious fall or if you hit your head (even if there is no bleeding).  Some medicines may interact with Xarelto and might increase your risk of bleeding while on Xarelto. To help avoid this, consult your healthcare provider or pharmacist prior to using any new prescription or non-prescription medications, including herbals, vitamins, non-steroidal anti-inflammatory drugs (NSAIDs) and supplements.  This website has more information on Xarelto: [email or web address]

## 2018-07-30 NOTE — Progress Notes (Signed)
Preliminary notes--Bilateral lower extremities venous duplex exam completed. Positive Acute deep vein thrombosis involving right femoral vein mid to distal segments. Right calf veins unable to well visualize due to patient body habitus, limited study cannot completely exclude possible thrombosis in calf veins.  Result called ED spoke with Dr. Myrene Buddy, he is aware of patient's condition. Hongying Ambika Zettlemoyer (RDMS RVT) 07/30/18 11:29 AM

## 2018-07-30 NOTE — ED Provider Notes (Signed)
[facility] DEPT Provider Note   CSN: [id number] Arrival date & time: 07/30/18  7425     History   Chief Complaint Chief Complaint  Patient presents with  . Leg Swelling    HPI Antonio Johnson is a 57 y.o. male.  HPI 57 year old male here with right leg swelling.  The patient states that over the last week, he has noticed progressively worsening, severe, right leg swelling.  Denies any pain.  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  He states that he noticed that he had difficulty getting his shoes and socks on on the right.  He denies any preceding injury or illness.  He does report he had a history of a right knee injury, but denies any leg swelling associated with this.  He does not smoke.  Denies any recent surgeries.  No known personal or family history of DVTs.  He has not taken anything.  He is tried to elevate his foot without significant relief.  He also notes some mild swelling on the left, which has been present for an unknown amount of time.  Denies any pain associated with this.  No popliteal pain.  No numbness or weakness.  No past medical history on file.  There are no active problems to display for this patient.   No past surgical history on file.      Home Medications    Prior to Admission medications   Medication Sig Start Date End Date Taking? Authorizing Provider  famotidine (PEPCID) 20 MG tablet Take 1 tablet (20 mg total) by mouth 2 (two) times daily. Patient taking differently: Take 20 mg by mouth daily.  09/02/16  Yes Kirichenko, Tatyana, PA-C  Multiple Vitamin (MULITIVITAMIN WITH MINERALS) TABS Take 1 tablet by mouth daily.   Yes [provider]  diclofenac sodium (VOLTAREN) 1 % GEL Apply 2 g topically 3 (three) times daily as needed. 07/30/18   Duffy Bruce, MD  dicyclomine (BENTYL) 20 MG tablet Take 1 tablet (20 mg total) by mouth 2 (two) times daily. Patient not taking: Reported  on 09/03/2016 04/01/16   Waynetta Pean, PA-C  HYDROcodone-acetaminophen (NORCO/VICODIN) 5-325 MG tablet Take 1-2 tablets by mouth every 6 (six) hours as needed for moderate pain or severe pain. Patient not taking: Reported on 07/30/2018 04/22/18   Duffy Bruce, MD  loperamide (IMODIUM) 2 MG capsule Take 1 capsule (2 mg total) by mouth 2 (two) times daily as needed for diarrhea or loose stools. Patient not taking: Reported on 07/30/2018 09/03/16   Tenna Delaine D, PA-C  ondansetron (ZOFRAN ODT) 4 MG disintegrating tablet Take 1 tablet (4 mg total) by mouth every 8 (eight) hours as needed for nausea or vomiting. Patient not taking: Reported on 09/03/2016 04/01/16   Waynetta Pean, PA-C  ondansetron (ZOFRAN ODT) 8 MG disintegrating tablet Take 1 tablet (8 mg total) by mouth every 8 (eight) hours as needed for nausea or vomiting. Patient not taking: Reported on 09/03/2016 09/02/16   Jeannett Senior, PA-C  Rivaroxaban 15 & 20 MG TBPK Take as directed: Start with one 24m tablet by mouth twice a day with food.On Day 22, switch to one 238mtablet once a day with food. 07/30/18   IsDuffy BruceMD    Family History No family history on file.  Social History Social History   Tobacco Use  . Smoking status: Never Smoker  . Smokeless tobacco: Never Used  Substance Use Topics  . Alcohol use: No  .  Drug use: No     Allergies   Patient has no known allergies.   Review of Systems Review of Systems  Constitutional: Negative for chills, fatigue and fever.  HENT: Negative for congestion and rhinorrhea.   Eyes: Negative for visual disturbance.  Respiratory: Negative for cough, shortness of breath and wheezing.   Cardiovascular: Positive for leg swelling. Negative for chest pain.  Gastrointestinal: Negative for abdominal pain, diarrhea, nausea and vomiting.  Genitourinary: Negative for dysuria and flank pain.  Musculoskeletal: Negative for neck pain and neck stiffness.  Skin: Negative  for rash and wound.  Allergic/Immunologic: Negative for immunocompromised state.  Neurological: Negative for syncope, weakness and headaches.  All other systems reviewed and are negative.    Physical Exam Updated Vital Signs BP (!) 170/105   Pulse 63   Temp 97.8 F (36.6 C) (Oral)   Resp 16   Ht '6\' 5"'  (1.956 m)   Wt 120.2 kg   SpO2 98%   BMI 31.42 kg/m   Physical Exam  Constitutional: He is oriented to person, place, and time. He appears well-developed and well-nourished. No distress.  HENT:  Head: Normocephalic and atraumatic.  Eyes: Conjunctivae are normal.  Neck: Neck supple.  Cardiovascular: Normal rate, regular rhythm and normal heart sounds. Exam reveals no friction rub.  No murmur heard. Pulmonary/Chest: Effort normal and breath sounds normal. No respiratory distress. He has no wheezes. He has no rales.  Abdominal: He exhibits no distension.  Musculoskeletal: He exhibits edema (3+ pitting edema right lower extremity, 2+ on the left.  No popliteal tenderness.).  Neurological: He is alert and oriented to person, place, and time. He exhibits normal muscle tone.  Strength 5 out of 5 bilateral distal lower extremities.  Normal sensation to light touch.  Skin: Skin is warm. Capillary refill takes less than 2 seconds.  Psychiatric: He has a normal mood and affect.  Nursing note and vitals reviewed.    ED Treatments / Results  Labs (all labs ordered are listed, but only abnormal results are displayed) Labs Reviewed  CBC WITH DIFFERENTIAL/PLATELET - Abnormal; Notable for the following components:      Result Value   WBC 3.8 (*)    HCT 38.5 (*)    All other components within normal limits  COMPREHENSIVE METABOLIC PANEL - Abnormal; Notable for the following components:   Glucose, Bld 250 (*)    Total Protein 8.4 (*)    All other components within normal limits  BRAIN NATRIURETIC PEPTIDE    EKG None  Radiology No results found.  Procedures Procedures  (including critical care time)  Medications Ordered in ED Medications  rivaroxaban (XARELTO) Education Kit for DVT/PE patients (has no administration in time range)  Rivaroxaban (XARELTO) tablet 15 mg (15 mg Oral Given 07/30/18 1312)     Initial Impression / Assessment and Plan / ED Course  I have reviewed the triage vital signs and the nursing notes.  Pertinent labs & imaging results that were available during my care of the patient were reviewed by me and considered in my medical decision making (see chart for details).     57 year old very well-appearing male here with new right greater than left leg swelling.  Patient is a Administrator.  Exam is concerning for likely DVT.  No evidence of arterial insufficiency or phlegmasia.  DVT study obtained and is consistent with mid to distal femoral vein DVT.  No evidence of proximal or iliac extension.  He is neurovascular intact.  Discussed case  with vascular surgery on-call, Dr. Trula Slade, who recommends systemic anticoagulation and PCP follow-up.  This was discussed with the patient in detail including the risks and benefits of starting anticoagulation.  Pharmacy was consulted and provided education.  I have also asked case management to set the patient up with a PCP appointment.  Will discharge with good return precautions and prescription for Xarelto as well as compression stockings.  Final Clinical Impressions(s) / ED Diagnoses   Final diagnoses:  Acute deep vein thrombosis (DVT) of femoral vein of right lower extremity [facility])    ED Discharge Orders         Ordered    Rivaroxaban 15 & 20 MG TBPK     07/30/18 1303    Compression stockings     07/30/18 1303    DME Other see comment    Comments:  Compression stockings   07/30/18 1303    diclofenac sodium (VOLTAREN) 1 % GEL  3 times daily PRN     07/30/18 1303           Duffy Bruce, MD 07/30/18 1538

## 2018-07-30 NOTE — ED Triage Notes (Signed)
Pt from home, reports swelling to right leg that started a few days ago.

## 2018-09-02 DIAGNOSIS — D649 Anemia, unspecified: Secondary | ICD-10-CM | POA: Diagnosis not present

## 2018-09-02 DIAGNOSIS — E119 Type 2 diabetes mellitus without complications: Secondary | ICD-10-CM | POA: Diagnosis not present

## 2018-09-02 DIAGNOSIS — I82411 Acute embolism and thrombosis of right femoral vein: Secondary | ICD-10-CM | POA: Diagnosis not present

## 2018-09-02 DIAGNOSIS — R739 Hyperglycemia, unspecified: Secondary | ICD-10-CM | POA: Diagnosis not present

## 2018-10-14 ENCOUNTER — Ambulatory Visit: Payer: 59 | Admitting: Registered"

## 2018-11-11 DIAGNOSIS — R972 Elevated prostate specific antigen [PSA]: Secondary | ICD-10-CM | POA: Diagnosis not present

## 2018-11-11 DIAGNOSIS — E291 Testicular hypofunction: Secondary | ICD-10-CM | POA: Diagnosis not present

## 2019-02-04 ENCOUNTER — Encounter (HOSPITAL_COMMUNITY): Payer: Self-pay | Admitting: Emergency Medicine

## 2019-02-04 ENCOUNTER — Ambulatory Visit (HOSPITAL_COMMUNITY)
Admission: EM | Admit: 2019-02-04 | Discharge: 2019-02-04 | Disposition: A | Discharge status: Home / Self Care | Payer: 59

## 2019-02-04 ENCOUNTER — Other Ambulatory Visit: Payer: Self-pay

## 2019-02-04 DIAGNOSIS — J22 Unspecified acute lower respiratory infection: Secondary | ICD-10-CM | POA: Diagnosis not present

## 2019-02-04 HISTORY — DX: Acute embolism and thrombosis of unspecified femoral vein: I82.419

## 2019-02-04 HISTORY — DX: Type 2 diabetes mellitus without complications: E11.9

## 2019-02-04 MED ORDER — ALBUTEROL SULFATE HFA 108 (90 BASE) MCG/ACT IN AERS: 1.0000 | INHALATION_SPRAY | Freq: Four times a day (QID) | RESPIRATORY_TRACT | 0 refills | Status: DC | PRN

## 2019-02-04 MED ORDER — AZITHROMYCIN 250 MG PO TABS: ORAL_TABLET | ORAL | 0 refills | Status: AC

## 2019-02-04 MED ORDER — CETIRIZINE HCL 10 MG PO TABS: 10.0000 mg | ORAL_TABLET | Freq: Every day | ORAL | 0 refills | Status: DC

## 2019-02-04 NOTE — Discharge Instructions (Signed)
Please quarantine as directed with provided information due to your and your wife's symptoms.  Based on exam of your lungs I am also going to provide an antibiotic, please complete.  Use of inhaler as needed for wheezing or shortness of breath.   Daily zyrtec. If develop worsening of symptoms, fever, shortness of breath , chest pain , or otherwise worsening please return or go to the ER

## 2019-02-04 NOTE — ED Provider Notes (Signed)
[name]    CSN: [id number] Arrival date & time: 02/04/19  1937     History   Chief Complaint Chief Complaint  Patient presents with  . URI    HPI Antonio Johnson is a 58 y.o. male.   Antonio Johnson presents with complaints of cough. states started approximately 1 week ago and was worse at that time. His wife was ill first, then he became ill, now his wife is worse again. She has fevers, works as a Occupational psychologist. He denies any fevers. Has some congestion. Did have a sore throat which has improved. No rash. No gi/gu complaints. No ear pain. Has been taking theraflu and mucinex which helps some. No shortness of breath , no chest pain . No history of asthma, doesn't smoke. No recent travel. No other known ill contacts. Drives truck for a living. He is on a blood thinner related to DVT history. Hx of DM.    ROS per HPI, negative if not otherwise mentioned.      Past Medical History:  Diagnosis Date  . Diabetes mellitus without complication ([place])   . Dvt femoral (deep venous thrombosis) ([facility])     There are no active problems to display for this patient.   History reviewed. No pertinent surgical history.     Home Medications    Prior to Admission medications   Medication Sig Start Date End Date Taking? Authorizing Provider  metFORMIN (GLUCOPHAGE-XR) 500 MG 24 hr tablet TAKE 2 TABLETS BY MOUTH EVERY DAY WITH EVENING MEAL 12/06/18  Yes [provider]  XARELTO 20 MG TABS tablet Take 20 mg by mouth daily. 12/06/18  Yes [provider]  albuterol (PROAIR HFA) 108 (90 Base) MCG/ACT inhaler Inhale 1-2 puffs into the lungs every 6 (six) hours as needed for wheezing or shortness of breath. 02/04/19   Zigmund Gottron, NP  azithromycin (ZITHROMAX) 250 MG tablet Take 2 tablets (500 mg total) by mouth daily for 1 day, THEN 1 tablet (250 mg total) daily for 4 days. 02/04/19 02/09/19  Zigmund Gottron, NP  cetirizine (ZYRTEC) 10 MG tablet Take 1 tablet (10 mg total) by  mouth daily. 02/04/19   Augusto Gamble B, NP  diclofenac sodium (VOLTAREN) 1 % GEL Apply 2 g topically 3 (three) times daily as needed. 07/30/18   Duffy Bruce, MD  dicyclomine (BENTYL) 20 MG tablet Take 1 tablet (20 mg total) by mouth 2 (two) times daily. Patient not taking: Reported on 09/03/2016 04/01/16   Waynetta Pean, PA-C  famotidine (PEPCID) 20 MG tablet Take 1 tablet (20 mg total) by mouth 2 (two) times daily. Patient taking differently: Take 20 mg by mouth daily.  09/02/16   Kirichenko, Lahoma Rocker, PA-C  HYDROcodone-acetaminophen (NORCO/VICODIN) 5-325 MG tablet Take 1-2 tablets by mouth every 6 (six) hours as needed for moderate pain or severe pain. Patient not taking: Reported on 07/30/2018 04/22/18   Duffy Bruce, MD  loperamide (IMODIUM) 2 MG capsule Take 1 capsule (2 mg total) by mouth 2 (two) times daily as needed for diarrhea or loose stools. Patient not taking: Reported on 07/30/2018 09/03/16   Tenna Delaine D, PA-C  Multiple Vitamin (MULITIVITAMIN WITH MINERALS) TABS Take 1 tablet by mouth daily.    [provider]  ondansetron (ZOFRAN ODT) 4 MG disintegrating tablet Take 1 tablet (4 mg total) by mouth every 8 (eight) hours as needed for nausea or vomiting. Patient not taking: Reported on 09/03/2016 04/01/16   Waynetta Pean, PA-C  ondansetron [facility] ODT)  8 MG disintegrating tablet Take 1 tablet (8 mg total) by mouth every 8 (eight) hours as needed for nausea or vomiting. Patient not taking: Reported on 09/03/2016 09/02/16   Jeannett Senior, PA-C  Rivaroxaban 15 & 20 MG TBPK Take as directed: Start with one 15mg  tablet by mouth twice a day with food.On Day 22, switch to one 20mg  tablet once a day with food. 07/30/18   Duffy Bruce, MD    Family History History reviewed. No pertinent family history.  Social History Social History   Tobacco Use  . Smoking status: Never Smoker  . Smokeless tobacco: Never Used  Substance Use Topics  . Alcohol use: No  .  Drug use: No     Allergies   Patient has no known allergies.   Review of Systems Review of Systems   Physical Exam Triage Vital Signs ED Triage Vitals  Enc Vitals Group     BP 02/04/19 0857 (!) 157/97     Pulse Rate 02/04/19 0857 65     Resp 02/04/19 0857 12     Temp 02/04/19 0857 97.9 F (36.6 C)     Temp Source 02/04/19 0857 Oral     SpO2 02/04/19 0857 100 %     Weight --      Height --      Head Circumference --      Peak Flow --      Pain Score 02/04/19 0854 0     Pain Loc --      Pain Edu? --      Excl. in [place]? --    No data found.  Updated Vital Signs BP (!) 157/97 (BP Location: Left Arm)   Pulse 65   Temp 97.9 F (36.6 C) (Oral)   Resp 12   SpO2 100%    Physical Exam Vitals signs reviewed.  Constitutional:      Appearance: He is well-developed.  HENT:     Head: Normocephalic and atraumatic.     Right Ear: Tympanic membrane, ear canal and external ear normal.     Left Ear: Tympanic membrane, ear canal and external ear normal.     Nose: Nose normal.     Right Sinus: No maxillary sinus tenderness or frontal sinus tenderness.     Left Sinus: No maxillary sinus tenderness or frontal sinus tenderness.     Mouth/Throat:     Pharynx: Uvula midline.  Eyes:     Conjunctiva/sclera: Conjunctivae normal.     Pupils: Pupils are equal, round, and reactive to light.  Neck:     Musculoskeletal: Normal range of motion.  Cardiovascular:     Rate and Rhythm: Normal rate and regular rhythm.  Pulmonary:     Effort: Pulmonary effort is normal. No tachypnea, bradypnea, accessory muscle usage, prolonged expiration, respiratory distress or retractions.     Breath sounds: No decreased air movement. Examination of the right-middle field reveals rhonchi. Examination of the right-lower field reveals rhonchi. Rhonchi present.  Lymphadenopathy:     Cervical: No cervical adenopathy.  Skin:    General: Skin is warm and dry.  Neurological:     Mental Status: He is alert and  oriented to person, place, and time.      UC Treatments / Results  Labs (all labs ordered are listed, but only abnormal results are displayed) Labs Reviewed - No data to display  EKG None  Radiology No results found.  Procedures Procedures (including critical care time)  Medications Ordered in UC Medications -  No data to display  Initial Impression / Assessment and Plan / UC Course  I have reviewed the triage vital signs and the nursing notes.  Pertinent labs & imaging results that were available during my care of the patient were reviewed by me and considered in my medical decision making (see chart for details).     Afebrile. No increased work of breathing. No hypoxia or tachypnea. Symptoms are improving. Wife with fever and potential Covid-19 exposure at work place. Discussed quarantine with patient and recommended at this time. Rhonchi present to right lung field however, opted to cover with azithromycin. Use of inhaler prn. Return precautions provided. Patient verbalized understanding and agreeable to plan.    Final Clinical Impressions(s) / UC Diagnoses   Final diagnoses:  Lower respiratory infection     Discharge Instructions     Please quarantine as directed with provided information due to your and your wife's symptoms.  Based on exam of your lungs I am also going to provide an antibiotic, please complete.  Use of inhaler as needed for wheezing or shortness of breath.   Daily zyrtec. If develop worsening of symptoms, fever, shortness of breath , chest pain , or otherwise worsening please return or go to the ER    ED Prescriptions    Medication Sig Dispense Auth. Provider   albuterol (PROAIR HFA) 108 (90 Base) MCG/ACT inhaler Inhale 1-2 puffs into the lungs every 6 (six) hours as needed for wheezing or shortness of breath. 1 Inhaler , Lanelle Bal B, NP   azithromycin (ZITHROMAX) 250 MG tablet Take 2 tablets (500 mg total) by mouth daily for 1 day, THEN 1  tablet (250 mg total) daily for 4 days. 6 tablet Augusto Gamble B, NP   cetirizine (ZYRTEC) 10 MG tablet Take 1 tablet (10 mg total) by mouth daily. 30 tablet Zigmund Gottron, NP     Controlled Substance Prescriptions [place] Controlled Substance Registry consulted? Not Applicable   Zigmund Gottron, NP 02/04/19 1132

## 2019-02-04 NOTE — ED Triage Notes (Addendum)
Pt complains of nasal congestion and cough x1 week.  He is here with his wife who has had a cough x2 weeks and a fever.  Pt denies any fever or SOB.

## 2019-02-11 ENCOUNTER — Ambulatory Visit (HOSPITAL_COMMUNITY)
Admission: EM | Admit: 2019-02-11 | Discharge: 2019-02-11 | Disposition: A | Discharge status: Home / Self Care | Payer: 59 | Attending: Family Medicine | Admitting: Family Medicine

## 2019-02-11 ENCOUNTER — Other Ambulatory Visit: Payer: Self-pay

## 2019-02-11 ENCOUNTER — Encounter (HOSPITAL_COMMUNITY): Payer: Self-pay

## 2019-02-11 DIAGNOSIS — R05 Cough: Secondary | ICD-10-CM | POA: Diagnosis not present

## 2019-02-11 DIAGNOSIS — R059 Cough, unspecified: Secondary | ICD-10-CM

## 2019-02-11 MED ORDER — BENZONATATE 100 MG PO CAPS: 100.0000 mg | ORAL_CAPSULE | Freq: Three times a day (TID) | ORAL | 0 refills | Status: DC

## 2019-02-11 NOTE — ED Provider Notes (Signed)
[facility]    CSN: [id number] Arrival date & time: 02/11/19  8588     History   Chief Complaint Chief Complaint  Patient presents with  . Follow-up    HPI Antonio Johnson is a 58 y.o. male.   Pt is a 58 year old male that presents with cough. He was seen here on 02/04/19 and  treated for lower respiratory tract infection. He has finished the meds but still having lingering cough. He is feeling better. Denies fever. He is still coughing up mucous. No chest pain, SOB. He is here wondering if he can return to work.   ROS per HPI      Past Medical History:  Diagnosis Date  . Diabetes mellitus without complication ([place])   . Dvt femoral (deep venous thrombosis) ([facility])     There are no active problems to display for this patient.   History reviewed. No pertinent surgical history.     Home Medications    Prior to Admission medications   Medication Sig Start Date End Date Taking? Authorizing Provider  albuterol (PROAIR HFA) 108 (90 Base) MCG/ACT inhaler Inhale 1-2 puffs into the lungs every 6 (six) hours as needed for wheezing or shortness of breath. 02/04/19   Zigmund Gottron, NP  benzonatate (TESSALON) 100 MG capsule Take 1 capsule (100 mg total) by mouth every 8 (eight) hours. 02/11/19   Loura Halt A, NP  cetirizine (ZYRTEC) 10 MG tablet Take 1 tablet (10 mg total) by mouth daily. 02/04/19   Augusto Gamble B, NP  diclofenac sodium (VOLTAREN) 1 % GEL Apply 2 g topically 3 (three) times daily as needed. 07/30/18   Duffy Bruce, MD  dicyclomine (BENTYL) 20 MG tablet Take 1 tablet (20 mg total) by mouth 2 (two) times daily. Patient not taking: Reported on 09/03/2016 04/01/16   Waynetta Pean, PA-C  famotidine (PEPCID) 20 MG tablet Take 1 tablet (20 mg total) by mouth 2 (two) times daily. Patient taking differently: Take 20 mg by mouth daily.  09/02/16   Kirichenko, Lahoma Rocker, PA-C  HYDROcodone-acetaminophen (NORCO/VICODIN) 5-325 MG tablet Take 1-2 tablets by mouth  every 6 (six) hours as needed for moderate pain or severe pain. Patient not taking: Reported on 07/30/2018 04/22/18   Duffy Bruce, MD  loperamide (IMODIUM) 2 MG capsule Take 1 capsule (2 mg total) by mouth 2 (two) times daily as needed for diarrhea or loose stools. Patient not taking: Reported on 07/30/2018 09/03/16   Tenna Delaine D, PA-C  metFORMIN (GLUCOPHAGE-XR) 500 MG 24 hr tablet TAKE 2 TABLETS BY MOUTH EVERY DAY WITH EVENING MEAL 12/06/18   [provider]  Multiple Vitamin (MULITIVITAMIN WITH MINERALS) TABS Take 1 tablet by mouth daily.    [provider]  ondansetron (ZOFRAN ODT) 4 MG disintegrating tablet Take 1 tablet (4 mg total) by mouth every 8 (eight) hours as needed for nausea or vomiting. Patient not taking: Reported on 09/03/2016 04/01/16   Waynetta Pean, PA-C  ondansetron (ZOFRAN ODT) 8 MG disintegrating tablet Take 1 tablet (8 mg total) by mouth every 8 (eight) hours as needed for nausea or vomiting. Patient not taking: Reported on 09/03/2016 09/02/16   Jeannett Senior, PA-C  Rivaroxaban 15 & 20 MG TBPK Take as directed: Start with one 15mg  tablet by mouth twice a day with food.On Day 22, switch to one 20mg  tablet once a day with food. 07/30/18   Duffy Bruce, MD  XARELTO 20 MG TABS tablet Take 20 mg by mouth daily. 12/06/18  [provider]    Family History History reviewed. No pertinent family history.  Social History Social History   Tobacco Use  . Smoking status: Never Smoker  . Smokeless tobacco: Never Used  Substance Use Topics  . Alcohol use: No  . Drug use: No     Allergies   Patient has no known allergies.   Review of Systems Review of Systems   Physical Exam Triage Vital Signs ED Triage Vitals  Enc Vitals Group     BP 02/11/19 0954 125/84     Pulse Rate 02/11/19 0954 80     Resp 02/11/19 0954 18     Temp 02/11/19 0954 98.7 F (37.1 C)     Temp Source 02/11/19 0954 Oral     SpO2 02/11/19 0954 98 %      Weight --      Height --      Head Circumference --      Peak Flow --      Pain Score 02/11/19 0955 0     Pain Loc --      Pain Edu? --      Excl. in [place]? --    No data found.  Updated Vital Signs BP 125/84 (BP Location: Left Arm)   Pulse 80   Temp 98.7 F (37.1 C) (Oral)   Resp 18   SpO2 98%   Visual Acuity Right Eye Distance:   Left Eye Distance:   Bilateral Distance:    Right Eye Near:   Left Eye Near:    Bilateral Near:     Physical Exam Vitals signs and nursing note reviewed.  Constitutional:      Appearance: Normal appearance.  HENT:     Head: Normocephalic and atraumatic.     Nose: Nose normal.  Eyes:     Conjunctiva/sclera: Conjunctivae normal.  Neck:     Musculoskeletal: Normal range of motion.  Pulmonary:     Effort: Pulmonary effort is normal.     Breath sounds: Normal breath sounds.  Musculoskeletal: Normal range of motion.  Skin:    General: Skin is warm and dry.  Neurological:     Mental Status: He is alert.  Psychiatric:        Mood and Affect: Mood normal.      UC Treatments / Results  Labs (all labs ordered are listed, but only abnormal results are displayed) Labs Reviewed - No data to display  EKG None  Radiology No results found.  Procedures Procedures (including critical care time)  Medications Ordered in UC Medications - No data to display  Initial Impression / Assessment and Plan / UC Course  I have reviewed the triage vital signs and the nursing notes.  Pertinent labs & imaging results that were available during my care of the patient were reviewed by me and considered in my medical decision making (see chart for details).     Pt appears well Lungs clear, VSS, non toxic or ill appearing Still mild lingering viral cough. Will treat with tessalon pearls.  I feel that it is safe for him to return to work.   Final Clinical Impressions(s) / UC Diagnoses   Final diagnoses:  Cough     Discharge Instructions      Tessalon pearls for cough I believe that it is safe for you to return to work on Sunday.     ED Prescriptions    Medication Sig Dispense Auth. Provider   benzonatate (TESSALON) 100 MG capsule Take  1 capsule (100 mg total) by mouth every 8 (eight) hours. 21 capsule Loura Halt A, NP     Controlled Substance Prescriptions [place] Controlled Substance Registry consulted? Not Applicable   Orvan July, NP 02/11/19 1043

## 2019-02-11 NOTE — ED Triage Notes (Signed)
Pt is following up on URI, he is still having symptoms of coughing and congestion.

## 2019-02-11 NOTE — Discharge Instructions (Signed)
Tessalon pearls for cough I believe that it is safe for you to return to work on Sunday.

## 2019-07-12 ENCOUNTER — Ambulatory Visit: Payer: 59 | Admitting: Physical Therapy

## 2019-07-21 ENCOUNTER — Ambulatory Visit: Payer: 59 | Attending: Family Medicine | Admitting: Physical Therapy

## 2019-07-21 DIAGNOSIS — M545 Low back pain: Secondary | ICD-10-CM | POA: Insufficient documentation

## 2019-07-21 DIAGNOSIS — R293 Abnormal posture: Secondary | ICD-10-CM | POA: Insufficient documentation

## 2019-07-21 DIAGNOSIS — M542 Cervicalgia: Secondary | ICD-10-CM | POA: Insufficient documentation

## 2019-07-21 DIAGNOSIS — R252 Cramp and spasm: Secondary | ICD-10-CM | POA: Insufficient documentation

## 2019-07-25 ENCOUNTER — Other Ambulatory Visit: Payer: Self-pay

## 2019-07-25 ENCOUNTER — Ambulatory Visit: Payer: 59

## 2019-07-25 DIAGNOSIS — R293 Abnormal posture: Secondary | ICD-10-CM

## 2019-07-25 DIAGNOSIS — M545 Low back pain, unspecified: Secondary | ICD-10-CM

## 2019-07-25 DIAGNOSIS — R252 Cramp and spasm: Secondary | ICD-10-CM

## 2019-07-25 DIAGNOSIS — M542 Cervicalgia: Secondary | ICD-10-CM | POA: Diagnosis not present
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
## 2019-07-25 NOTE — Patient Instructions (Signed)
Access Code: [email or web address]  URL: [email or web address]  Date: 07/25/2019  Prepared by: Sigurd Sos   Exercises Supine Lower Trunk Rotation - 3 reps - 1 sets - 20 hold - 3x daily - 7x weekly Hooklying Single Knee to Chest - 3 reps - 1 sets - 20 hold - 3x daily - 7x weekly Seated Hamstring Stretch - 3 reps - 1 sets - 20 hold - 3x daily - 7x weekly Seated Cervical Flexion AROM - 10 reps - 3 sets - 20 hold - 3x daily - 7x weekly Seated Cervical Sidebending AROM - 10 reps - 3 sets - 20 hold - 3x daily - 7x weekly Seated Cervical Rotation AROM - 10 reps - 3 sets - 20 hold - 3x daily - 7x weekly Seated Correct Posture - 10 reps - 3 sets - 3x daily - 7x weekly

## 2019-07-27 ENCOUNTER — Other Ambulatory Visit: Payer: Self-pay

## 2019-07-27 ENCOUNTER — Ambulatory Visit: Payer: 59 | Admitting: Physical Therapy

## 2019-07-27 ENCOUNTER — Encounter: Payer: Self-pay | Admitting: Physical Therapy

## 2019-07-27 DIAGNOSIS — M545 Low back pain, unspecified: Secondary | ICD-10-CM

## 2019-07-27 DIAGNOSIS — M542 Cervicalgia: Secondary | ICD-10-CM | POA: Diagnosis not present

## 2019-07-27 DIAGNOSIS — R293 Abnormal posture: Secondary | ICD-10-CM

## 2019-07-27 DIAGNOSIS — R252 Cramp and spasm: Secondary | ICD-10-CM
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
## 2019-08-03 ENCOUNTER — Other Ambulatory Visit: Payer: Self-pay

## 2019-08-03 ENCOUNTER — Ambulatory Visit: Payer: 59 | Admitting: Physical Therapy

## 2019-08-03 ENCOUNTER — Encounter: Payer: Self-pay | Admitting: Physical Therapy

## 2019-08-03 DIAGNOSIS — M542 Cervicalgia: Secondary | ICD-10-CM

## 2019-08-03 DIAGNOSIS — R252 Cramp and spasm: Secondary | ICD-10-CM

## 2019-08-03 DIAGNOSIS — M545 Low back pain, unspecified: Secondary | ICD-10-CM

## 2019-08-03 DIAGNOSIS — R293 Abnormal posture: Secondary | ICD-10-CM
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
## 2019-08-10 ENCOUNTER — Encounter: Payer: Self-pay | Admitting: Physical Therapy

## 2019-08-10 ENCOUNTER — Other Ambulatory Visit: Payer: Self-pay

## 2019-08-10 ENCOUNTER — Ambulatory Visit: Payer: 59 | Admitting: Physical Therapy

## 2019-08-10 DIAGNOSIS — M545 Low back pain, unspecified: Secondary | ICD-10-CM

## 2019-08-10 DIAGNOSIS — R293 Abnormal posture: Secondary | ICD-10-CM

## 2019-08-10 DIAGNOSIS — R252 Cramp and spasm: Secondary | ICD-10-CM

## 2019-08-10 DIAGNOSIS — M542 Cervicalgia: Secondary | ICD-10-CM | POA: Diagnosis not present
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
## 2019-08-12 ENCOUNTER — Encounter: Payer: Self-pay | Admitting: Physical Therapy

## 2019-08-12 ENCOUNTER — Other Ambulatory Visit: Payer: Self-pay

## 2019-08-12 ENCOUNTER — Ambulatory Visit: Payer: 59 | Attending: Family Medicine | Admitting: Physical Therapy

## 2019-08-12 DIAGNOSIS — M542 Cervicalgia: Secondary | ICD-10-CM | POA: Insufficient documentation

## 2019-08-12 DIAGNOSIS — M545 Low back pain, unspecified: Secondary | ICD-10-CM

## 2019-08-12 DIAGNOSIS — R252 Cramp and spasm: Secondary | ICD-10-CM | POA: Diagnosis present

## 2019-08-12 DIAGNOSIS — R293 Abnormal posture: Secondary | ICD-10-CM
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
## 2019-08-15 ENCOUNTER — Ambulatory Visit: Payer: 59

## 2019-08-15 ENCOUNTER — Other Ambulatory Visit: Payer: Self-pay

## 2019-08-15 DIAGNOSIS — R293 Abnormal posture: Secondary | ICD-10-CM

## 2019-08-15 DIAGNOSIS — M545 Low back pain, unspecified: Secondary | ICD-10-CM

## 2019-08-15 DIAGNOSIS — M542 Cervicalgia: Secondary | ICD-10-CM

## 2019-08-15 DIAGNOSIS — R252 Cramp and spasm: Secondary | ICD-10-CM

## 2019-08-15 NOTE — Therapy (Addendum)
[facility] [address] [place], [place], [address] Phone: [phone]   Fax:  [phone]  Physical Therapy Treatment  Patient Details  Name: Antonio Johnson MRN: [id number] Date of Birth: 04-03-1961 Referring Provider (PT): Lujean Amel, MD   Encounter Date: 08/15/2019  PT End of Session - 08/15/19 1616    Visit Number  6    Date for PT Re-Evaluation  09/19/19    Authorization Type  [employer]    Authorization - Visit Number  6    Authorization - Number of Visits  68    PT Start Time  7517   late   PT Stop Time  1615    PT Time Calculation (min)  29 min    Activity Tolerance  Patient tolerated treatment well    Behavior During Therapy  [facility] for tasks assessed/performed       Past Medical History:  Diagnosis Date  . Diabetes mellitus without complication ([place])   . Dvt femoral (deep venous thrombosis) ([place])     History reviewed. No pertinent surgical history.  There were no vitals filed for this visit.  Subjective Assessment - 08/15/19 1546    Subjective  I am feeling better and better each day.    Patient Stated Goals  sit/stand/walk without limitation, lift without limitation    Currently in Pain?  Yes    Pain Score  5     Pain Location  Back    Pain Orientation  Lower    Pain Descriptors / Indicators  Sore    Pain Type  Acute pain    Pain Onset  More than a month ago    Pain Frequency  Intermittent    Aggravating Factors   work tasks, end of the day    Pain Relieving Factors  exericses, heat    Pain Score  5    Pain Location  Neck    Pain Descriptors / Indicators  Aching;Sore    Pain Onset  More than a month ago    Pain Frequency  Intermittent    Aggravating Factors   turning head    Pain Relieving Factors  not being as active         [facility] PT Assessment - 08/15/19 0001      AROM   Cervical - Right Rotation  60    Cervical - Left Rotation  75                   [facility] Adult PT  Treatment/Exercise - 08/15/19 0001      Lumbar Exercises: Stretches   Active Hamstring Stretch  Right;Left;2 reps;20 seconds      Lumbar Exercises: Aerobic   Nustep  L3 x 8 min with discussion of status       Lumbar Exercises: Standing   Row  Strengthening;Both;15 reps;Theraband    Theraband Level (Row)  Level 3 (Green)   VC/TC to reduced lordosis, pt has difficulty doing   Shoulder Extension  Strengthening;Both;20 reps;Theraband    Theraband Level (Shoulder Extension)  Level 3 (Green)      Lumbar Exercises: Sidelying   Other Sidelying Lumbar Exercises  open book stetch: x10 bil                PT Short Term Goals - 08/15/19 1551      PT SHORT TERM GOAL #2   Title  reduce LBP to stand and walk > or = to 20 minutes without limitation  Baseline  30 minutes    Status  Achieved      PT SHORT TERM GOAL #4   Title  report a 30% reduction in cervical and lumbar pain to improve function at home and work    Baseline  65-70%    Status  Achieved        PT Long Term Goals - 08/15/19 1550      PT LONG TERM GOAL #3   Title  sit for > or = to 1 hour without increased LBP to improve tolerance for driving for work    Baseline  1 hour max    Time  8    Period  Weeks    Status  On-going      PT LONG TERM GOAL #5   Title  reduce pain to tolerate standing and walking > or = to 45 minutes to improve function at home and in the community    Baseline  30 minutes    Time  8    Period  Weeks    Status  On-going            Plan - 08/15/19 1604    Clinical Impression Statement  Pt reports 65-70% overall improvement in symptoms since the start of care.  Pt demonstrates improved cervical A/ROM bilaterally with increased pain and more limitation in rotation ROM to the Rt.  Pt is able to tolerate sitting 1 hour and standing/walking 30 minutes.  Pt with significant hip stiffness bilaterally and is performing hip stretches at home.  Pt will continue to benefit from skilled PT to  address flexibility, advancement mobility and address pain as needed.    PT Frequency  3x / week    PT Duration  8 weeks    PT Treatment/Interventions  ADLs/Self Care Home Management;Cryotherapy;Electrical Stimulation;Ultrasound;Traction;Stair training;Functional mobility training;Therapeutic activities;Therapeutic exercise;Neuromuscular re-education;Manual techniques;Patient/family education;Passive range of motion;Dry needling;Taping;Spinal Manipulations;Joint Manipulations    PT Next Visit Plan  Hip flexibility, core & back strength specifically for lifting. dry needling to lumbar spine and cervical spine.    PT Home Exercise Plan  Access Code: [email or web address]    Consulted and Agree with Plan of Care  Patient       Patient will benefit from skilled therapeutic intervention in order to improve the following deficits and impairments:  Decreased activity tolerance, Decreased mobility, Decreased range of motion, Decreased strength, Difficulty walking, Increased muscle spasms, Impaired flexibility, Postural dysfunction, Improper body mechanics, Pain  Visit Diagnosis: Cervicalgia  Acute bilateral low back pain without sciatica  Cramp and spasm  Abnormal posture     Problem List There are no active problems to display for this patient.    Sigurd Sos, PT 08/15/19 4:17 PM PHYSICAL THERAPY DISCHARGE SUMMARY  Visits from Start of Care: 6  Current functional level related to goals / functional outcomes: Pt called 08/22/19 and requested D/C from PT as he is feeling better.  See above for most current PT status.    Remaining deficits: See above.  Pt called and reported feeling much better and requested D/C.    Education / Equipment: HEP  Plan: Patient agrees to discharge.  Patient goals were partially met. Patient is being discharged due to being pleased with the current functional level.  ?????      Sigurd Sos, PT 08/22/19 3:37 PM   [facility] [address] [place], [place], [address] Phone: [phone]   Fax:  [phone]  Name: Antonio Appelt  Johnson MRN: [id number] Date of Birth: 1960-12-25

## 2019-08-17 ENCOUNTER — Telehealth: Payer: Self-pay

## 2019-08-17 ENCOUNTER — Ambulatory Visit: Payer: 59

## 2019-08-17 NOTE — Telephone Encounter (Signed)
PT called pt due to no-show appointment today.  He reported that his wife had to get a COVID test and he is going to quarantine until her results come back.  Pt agreed to call if he can't come to his next appointment.

## 2019-08-19 ENCOUNTER — Ambulatory Visit: Payer: 59 | Admitting: Physical Therapy

## 2019-08-22 ENCOUNTER — Ambulatory Visit: Payer: 59

## 2019-08-24 ENCOUNTER — Ambulatory Visit: Payer: 59

## 2019-11-26 ENCOUNTER — Emergency Department (HOSPITAL_COMMUNITY): Payer: 59

## 2019-11-26 ENCOUNTER — Other Ambulatory Visit: Payer: Self-pay

## 2019-11-26 ENCOUNTER — Emergency Department (HOSPITAL_COMMUNITY)
Admission: EM | Admit: 2019-11-26 | Discharge: 2019-11-26 | Disposition: A | Discharge status: Home / Self Care | Payer: 59 | Attending: Emergency Medicine | Admitting: Emergency Medicine

## 2019-11-26 ENCOUNTER — Encounter (HOSPITAL_COMMUNITY): Payer: Self-pay | Admitting: Emergency Medicine

## 2019-11-26 DIAGNOSIS — Z7984 Long term (current) use of oral hypoglycemic drugs: Secondary | ICD-10-CM | POA: Diagnosis not present

## 2019-11-26 DIAGNOSIS — I10 Essential (primary) hypertension: Secondary | ICD-10-CM | POA: Diagnosis not present

## 2019-11-26 DIAGNOSIS — Z79899 Other long term (current) drug therapy: Secondary | ICD-10-CM | POA: Diagnosis not present

## 2019-11-26 DIAGNOSIS — R519 Headache, unspecified: Secondary | ICD-10-CM | POA: Insufficient documentation

## 2019-11-26 DIAGNOSIS — E119 Type 2 diabetes mellitus without complications: Secondary | ICD-10-CM | POA: Diagnosis not present

## 2019-11-26 LAB — BASIC METABOLIC PANEL
Anion gap: 10 (ref 5–15)
BUN: 18 mg/dL (ref 6–20)
CO2: 25 mmol/L (ref 22–32)
Calcium: 9.3 mg/dL (ref 8.9–10.3)
Chloride: 108 mmol/L (ref 98–111)
Creatinine, Ser: 1.26 mg/dL — ABNORMAL HIGH (ref 0.61–1.24)
GFR calc Af Amer: >60 mL/min (ref >60)
GFR calc non Af Amer: >60 mL/min (ref >60)
Glucose, Bld: 159 mg/dL — ABNORMAL HIGH (ref 70–99)
Potassium: 3.7 mmol/L (ref 3.5–5.1)
Sodium: 143 mmol/L (ref 135–145)

## 2019-11-26 LAB — CBC
HCT: 40.7 % (ref 39.0–52.0)
Hemoglobin: 13.2 g/dL (ref 13.0–17.0)
MCH: 28 pg (ref 26.0–34.0)
MCHC: 32.4 g/dL (ref 30.0–36.0)
MCV: 86.4 fL (ref 80.0–100.0)
Platelets: 223 10*3/uL (ref 150–400)
RBC: 4.71 MIL/uL (ref 4.22–5.81)
RDW: 13.1 % (ref 11.5–15.5)
WBC: 3.9 10*3/uL — ABNORMAL LOW (ref 4.0–10.5)
nRBC: 0 % (ref 0.0–0.2)

## 2019-11-26 MED ORDER — AMLODIPINE BESYLATE 5 MG PO TABS: 5.0000 mg | ORAL_TABLET | Freq: Every day | ORAL | 1 refills | Status: DC

## 2019-11-26 MED ORDER — MORPHINE SULFATE (PF) 4 MG/ML IV SOLN
4.0000 mg | Freq: Once | INTRAVENOUS | Status: AC
  Administered 2019-11-26: 4 mg via INTRAVENOUS
  Filled 2019-11-26: qty 1

## 2019-11-26 MED ORDER — PROCHLORPERAZINE EDISYLATE 10 MG/2ML IJ SOLN
10.0000 mg | Freq: Once | INTRAMUSCULAR | Status: AC
  Administered 2019-11-26: 10 mg via INTRAVENOUS
  Filled 2019-11-26: qty 2

## 2019-11-26 MED ORDER — AMLODIPINE BESYLATE 5 MG PO TABS
5.0000 mg | ORAL_TABLET | Freq: Once | ORAL | Status: AC
  Administered 2019-11-26: 5 mg via ORAL
  Filled 2019-11-26: qty 1

## 2019-11-26 NOTE — ED Triage Notes (Signed)
Per pt, states he has had a headache for 2 weeks-OTC meds not helping-no other symptoms

## 2019-11-26 NOTE — ED Provider Notes (Signed)
[place] DEPT Provider Note   CSN: [id number] Arrival date & time: 11/26/19  [id number]     History Chief Complaint  Patient presents with  . Headache    Antonio Johnson is a 59 y.o. male.  HPI   Patient presents to the emergency room for evaluation of a headache.  Patient states the symptoms started a couple weeks ago.  It has been gradually getting worse since then.  Patient has been trying over-the-counter medications without any relief of the headache.  He denies any neck pain.  He denies any trouble with his vision or his speech.  He does not have any focal numbness or weakness.  No fevers or chills.  No cough no sore throat.  Patient used to be on anticoagulants but is not on any currently.  He denies any history of hypertension.  Past Medical History:  Diagnosis Date  . Diabetes mellitus without complication ([place])   . Dvt femoral (deep venous thrombosis) ([facility])     There are no problems to display for this patient.   History reviewed. No pertinent surgical history.     No family history on file.  Social History   Tobacco Use  . Smoking status: Never Smoker  . Smokeless tobacco: Never Used  Substance Use Topics  . Alcohol use: No  . Drug use: No    Home Medications Prior to Admission medications   Medication Sig Start Date End Date Taking? Authorizing Provider  albuterol (PROAIR HFA) 108 (90 Base) MCG/ACT inhaler Inhale 1-2 puffs into the lungs every 6 (six) hours as needed for wheezing or shortness of breath. 02/04/19  Yes Burky, Natalie B, NP  ibuprofen (ADVIL) 200 MG tablet Take 800 mg by mouth every 6 (six) hours as needed for headache.   Yes [provider]  metFORMIN (GLUCOPHAGE-XR) 500 MG 24 hr tablet Take 1,000 mg by mouth every evening.  12/06/18  Yes [provider]  Multiple Vitamin (MULITIVITAMIN WITH MINERALS) TABS Take 1 tablet by mouth daily.   Yes [provider]  XARELTO 20 MG TABS tablet Take  20 mg by mouth daily. 12/06/18  Yes [provider]  amLODipine (NORVASC) 5 MG tablet Take 1 tablet (5 mg total) by mouth daily. 11/26/19   Dorie Rank, MD  benzonatate (TESSALON) 100 MG capsule Take 1 capsule (100 mg total) by mouth every 8 (eight) hours. Patient not taking: Reported on 11/26/2019 02/11/19   Loura Halt A, NP  cetirizine (ZYRTEC) 10 MG tablet Take 1 tablet (10 mg total) by mouth daily. Patient not taking: Reported on 11/26/2019 02/04/19   Augusto Gamble B, NP  diclofenac sodium (VOLTAREN) 1 % GEL Apply 2 g topically 3 (three) times daily as needed. Patient not taking: Reported on 11/26/2019 07/30/18   Duffy Bruce, MD  dicyclomine (BENTYL) 20 MG tablet Take 1 tablet (20 mg total) by mouth 2 (two) times daily. Patient not taking: Reported on 09/03/2016 04/01/16   Waynetta Pean, PA-C  famotidine (PEPCID) 20 MG tablet Take 1 tablet (20 mg total) by mouth 2 (two) times daily. Patient taking differently: Take 20 mg by mouth daily.  09/02/16   Kirichenko, Lahoma Rocker, PA-C  HYDROcodone-acetaminophen (NORCO/VICODIN) 5-325 MG tablet Take 1-2 tablets by mouth every 6 (six) hours as needed for moderate pain or severe pain. Patient not taking: Reported on 07/30/2018 04/22/18   Duffy Bruce, MD  loperamide (IMODIUM) 2 MG capsule Take 1 capsule (2 mg total) by mouth 2 (two) times daily as  needed for diarrhea or loose stools. Patient not taking: Reported on 07/30/2018 09/03/16   Tenna Delaine D, PA-C  ondansetron (ZOFRAN ODT) 4 MG disintegrating tablet Take 1 tablet (4 mg total) by mouth every 8 (eight) hours as needed for nausea or vomiting. Patient not taking: Reported on 09/03/2016 04/01/16   Waynetta Pean, PA-C  ondansetron (ZOFRAN ODT) 8 MG disintegrating tablet Take 1 tablet (8 mg total) by mouth every 8 (eight) hours as needed for nausea or vomiting. Patient not taking: Reported on 09/03/2016 09/02/16   Jeannett Senior, PA-C  Rivaroxaban 15 & 20 MG TBPK Take as directed: Start  with one 15mg  tablet by mouth twice a day with food.On Day 22, switch to one 20mg  tablet once a day with food. Patient not taking: Reported on 11/26/2019 07/30/18   Duffy Bruce, MD    Allergies    Patient has no known allergies.  Review of Systems   Review of Systems  All other systems reviewed and are negative.   Physical Exam Updated Vital Signs BP (!) 168/93   Pulse 75   Temp 97.7 F (36.5 C) (Oral)   Resp 15   Ht 1.981 m (6\' 6" )   Wt 113.4 kg   SpO2 95%   BMI 28.89 kg/m   Physical Exam Vitals and nursing note reviewed.  Constitutional:      General: He is not in acute distress.    Appearance: He is well-developed.  HENT:     Head: Normocephalic and atraumatic.     Right Ear: External ear normal.     Left Ear: External ear normal.  Eyes:     General: No scleral icterus.       Right eye: No discharge.        Left eye: No discharge.     Conjunctiva/sclera: Conjunctivae normal.  Neck:     Trachea: No tracheal deviation.  Cardiovascular:     Rate and Rhythm: Normal rate and regular rhythm.  Pulmonary:     Effort: Pulmonary effort is normal. No respiratory distress.     Breath sounds: Normal breath sounds. No stridor. No wheezing or rales.  Abdominal:     General: Bowel sounds are normal. There is no distension.     Palpations: Abdomen is soft.     Tenderness: There is no abdominal tenderness. There is no guarding or rebound.  Musculoskeletal:        General: No tenderness.     Cervical back: Neck supple.  Skin:    General: Skin is warm and dry.     Findings: No rash.  Neurological:     Mental Status: He is alert and oriented to person, place, and time.     Cranial Nerves: No cranial nerve deficit (No facial droop, extraocular movements intact, tongue midline ).     Sensory: No sensory deficit.     Motor: No abnormal muscle tone or seizure activity.     Coordination: Coordination normal.     Comments: No pronator drift bilateral upper extrem, able to hold  both legs off bed for 5 seconds, sensation intact in all extremities, no visual field cuts, no left or right sided neglect,  no nystagmus noted      ED Results / Procedures / Treatments   Labs (all labs ordered are listed, but only abnormal results are displayed) Labs Reviewed  CBC - Abnormal; Notable for the following components:      Result Value   WBC 3.9 (*)  All other components within normal limits  BASIC METABOLIC PANEL - Abnormal; Notable for the following components:   Glucose, Bld 159 (*)    Creatinine, Ser 1.26 (*)    All other components within normal limits    EKG None  Radiology CT Head Wo Contrast  Result Date: 11/26/2019 CLINICAL DATA:  Headache. EXAM: CT HEAD WITHOUT CONTRAST TECHNIQUE: Contiguous axial images were obtained from the base of the skull through the vertex without intravenous contrast. COMPARISON:  Apr 02, 2004. FINDINGS: Brain: No evidence of acute infarction, hemorrhage, hydrocephalus, extra-axial collection or mass lesion/mass effect. Vascular: No hyperdense vessel or unexpected calcification. Skull: Normal. Negative for fracture or focal lesion. Sinuses/Orbits: No acute finding. Other: None. IMPRESSION: Normal head CT. Electronically Signed   By: Marijo Conception M.D.   On: 11/26/2019 09:49    Procedures Procedures (including critical care time)  Medications Ordered in ED Medications  prochlorperazine (COMPAZINE) injection 10 mg (10 mg Intravenous Given 11/26/19 0902)  morphine 4 MG/ML injection 4 mg (4 mg Intravenous Given 11/26/19 0902)  amLODipine (NORVASC) tablet 5 mg (5 mg Oral Given 11/26/19 [id number])    ED Course  I have reviewed the triage vital signs and the nursing notes.  Pertinent labs & imaging results that were available during my care of the patient were reviewed by me and considered in my medical decision making (see chart for details).  Clinical Course as of Nov 26 1035  Sat Nov 26, 2019  1001 Head CT without acute findings.   Laboratory tests are unremarkable   [JK]    Clinical Course User Index [JK] Dorie Rank, MD   MDM Rules/Calculators/A&P                      Patient presented to the ED for evaluation of headache.  Patient was noted to be hypertensive.  Patient's headache onset is gradual.  No thunderclap to suggest subarachnoid hemorrhage.  CT scan does not show any evidence of intracerebral hemorrhage.  No focal neurologic symptoms to suggest stroke.  Patient was given medications for headache.  His symptoms have improved.  Possible his hypertension could have been contributing to his headache symptoms.  I will start him on a course of antihypertensive agents and recommend close follow-up with primary care doctor.  Warning signs and precautions discussed. Final Clinical Impression(s) / ED Diagnoses Final diagnoses:  Acute nonintractable headache, unspecified headache type  Hypertension, unspecified type    Rx / DC Orders ED Discharge Orders         Ordered    amLODipine (NORVASC) 5 MG tablet  Daily     11/26/19 1033           Dorie Rank, MD 11/27/19 [phone]

## 2019-11-26 NOTE — Discharge Instructions (Addendum)
Start taking the blood pressure medication daily, continue over-the-counter medications as needed for your headache, follow-up with your primary care doctor to check on your blood pressure and make sure your headache symptoms have resolved, return as needed for worsening symptoms

## 2019-11-26 NOTE — ED Notes (Signed)
Pt declines updating vital signs prior to discharge.

## 2020-01-26 ENCOUNTER — Ambulatory Visit: Payer: 59 | Attending: Family

## 2020-01-26 DIAGNOSIS — Z23 Encounter for immunization: Secondary | ICD-10-CM

## 2020-01-26 NOTE — Progress Notes (Signed)
2  Covid-19 Vaccination Clinic  Name:  Antonio Johnson    MRN: [id number] DOB: October 11, 1961  01/26/2020  [name] was observed post Covid-19 immunization for 15 minutes without incident. He was provided with Vaccine Information Sheet and instruction to access the V-Safe system.   [name] was instructed to call 911 with any severe reactions post vaccine: Marland Kitchen Difficulty breathing  . Swelling of face and throat  . A fast heartbeat  . A bad rash all over body  . Dizziness and weakness   Immunizations Administered    Name Date Dose VIS Date Route   Moderna COVID-19 Vaccine 01/26/2020  3:57 PM 0.5 mL 10/11/2019 Intramuscular   Manufacturer: Moderna   Lot: [id number]   [id number]

## 2020-02-28 ENCOUNTER — Ambulatory Visit: Payer: 59 | Attending: Family

## 2020-02-28 DIAGNOSIS — Z23 Encounter for immunization: Secondary | ICD-10-CM

## 2020-02-28 NOTE — Progress Notes (Signed)
   Covid-19 Vaccination Clinic  Name:  Antonio Johnson    MRN: [id number] DOB: 10-15-1961  02/28/2020  [name] was observed post Covid-19 immunization for 15 minutes without incident. He was provided with Vaccine Information Sheet and instruction to access the V-Safe system.   [name] was instructed to call 911 with any severe reactions post vaccine: Marland Kitchen Difficulty breathing  . Swelling of face and throat  . A fast heartbeat  . A bad rash all over body  . Dizziness and weakness   Immunizations Administered    Name Date Dose VIS Date Route   Moderna COVID-19 Vaccine 02/28/2020  3:52 PM 0.5 mL 10/2019 Intramuscular   Manufacturer: Moderna   Lot: [id number]   [id number]

## 2021-02-19 ENCOUNTER — Ambulatory Visit (HOSPITAL_COMMUNITY)
Admission: EM | Admit: 2021-02-19 | Discharge: 2021-02-19 | Disposition: A | Discharge status: Home / Self Care | Payer: 59 | Attending: Emergency Medicine | Admitting: Emergency Medicine

## 2021-02-19 ENCOUNTER — Encounter (HOSPITAL_COMMUNITY): Payer: Self-pay | Admitting: *Deleted

## 2021-02-19 ENCOUNTER — Other Ambulatory Visit: Payer: Self-pay

## 2021-02-19 DIAGNOSIS — S161XXA Strain of muscle, fascia and tendon at neck level, initial encounter: Secondary | ICD-10-CM

## 2021-02-19 DIAGNOSIS — M542 Cervicalgia: Secondary | ICD-10-CM

## 2021-02-19 MED ORDER — NAPROXEN 500 MG PO TABS: 500.0000 mg | ORAL_TABLET | Freq: Two times a day (BID) | ORAL | 0 refills | Status: DC

## 2021-02-19 MED ORDER — TIZANIDINE HCL 4 MG PO TABS: 4.0000 mg | ORAL_TABLET | Freq: Four times a day (QID) | ORAL | 0 refills | Status: DC | PRN
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
## 2021-02-19 NOTE — Discharge Instructions (Signed)
Take the naproxen today as needed for pain.  You can take Zanaflex as needed for muscle pain and spasms.  Use hot compresses and heat to help with muscle aches.  I would recommend taking a hot bath or shower tonight.  Take it easy and rest for the next few days.  Follow-up with your primary care if your symptoms do not improve in the next few days.

## 2021-03-02 ENCOUNTER — Emergency Department (HOSPITAL_COMMUNITY)
Admission: EM | Admit: 2021-03-02 | Discharge: 2021-03-02 | Disposition: A | Discharge status: Home / Self Care | Payer: 59 | Attending: Emergency Medicine | Admitting: Emergency Medicine

## 2021-03-02 ENCOUNTER — Ambulatory Visit (HOSPITAL_COMMUNITY)
Admission: EM | Admit: 2021-03-02 | Discharge: 2021-03-02 | Disposition: A | Discharge status: Home / Self Care | Payer: 59 | Attending: Internal Medicine | Admitting: Internal Medicine

## 2021-03-02 ENCOUNTER — Emergency Department (HOSPITAL_COMMUNITY): Payer: 59

## 2021-03-02 ENCOUNTER — Encounter (HOSPITAL_COMMUNITY): Payer: Self-pay | Admitting: Emergency Medicine

## 2021-03-02 ENCOUNTER — Other Ambulatory Visit: Payer: Self-pay

## 2021-03-02 DIAGNOSIS — R1011 Right upper quadrant pain: Secondary | ICD-10-CM

## 2021-03-02 DIAGNOSIS — Z7984 Long term (current) use of oral hypoglycemic drugs: Secondary | ICD-10-CM | POA: Diagnosis not present

## 2021-03-02 DIAGNOSIS — R1084 Generalized abdominal pain: Secondary | ICD-10-CM | POA: Diagnosis present

## 2021-03-02 DIAGNOSIS — E119 Type 2 diabetes mellitus without complications: Secondary | ICD-10-CM | POA: Insufficient documentation

## 2021-03-02 DIAGNOSIS — R1012 Left upper quadrant pain: Secondary | ICD-10-CM

## 2021-03-02 DIAGNOSIS — K760 Fatty (change of) liver, not elsewhere classified: Secondary | ICD-10-CM | POA: Insufficient documentation

## 2021-03-02 DIAGNOSIS — K529 Noninfective gastroenteritis and colitis, unspecified: Secondary | ICD-10-CM | POA: Diagnosis not present

## 2021-03-02 DIAGNOSIS — I10 Essential (primary) hypertension: Secondary | ICD-10-CM

## 2021-03-02 DIAGNOSIS — Z7901 Long term (current) use of anticoagulants: Secondary | ICD-10-CM | POA: Insufficient documentation

## 2021-03-02 LAB — LIPASE, BLOOD: Lipase: 27 U/L (ref 11–51)

## 2021-03-02 LAB — POCT URINALYSIS DIPSTICK, ED / UC
Bilirubin Urine: NEGATIVE
Glucose, UA: NEGATIVE mg/dL
Hgb urine dipstick: NEGATIVE
Leukocytes,Ua: NEGATIVE
Nitrite: NEGATIVE
Protein, ur: 100 mg/dL — AB
Specific Gravity, Urine: 1.025 (ref 1.005–1.030)
Urobilinogen, UA: 0.2 mg/dL (ref 0.0–1.0)
pH: 5.5 (ref 5.0–8.0)

## 2021-03-02 LAB — COMPREHENSIVE METABOLIC PANEL
ALT: 35 U/L (ref 0–44)
AST: 28 U/L (ref 15–41)
Albumin: 4.2 g/dL (ref 3.5–5.0)
Alkaline Phosphatase: 61 U/L (ref 38–126)
Anion gap: 9 (ref 5–15)
BUN: 12 mg/dL (ref 6–20)
CO2: 25 mmol/L (ref 22–32)
Calcium: 9.6 mg/dL (ref 8.9–10.3)
Chloride: 105 mmol/L (ref 98–111)
Creatinine, Ser: 1.28 mg/dL — ABNORMAL HIGH (ref 0.61–1.24)
GFR, Estimated: >60 mL/min (ref >60)
Glucose, Bld: 123 mg/dL — ABNORMAL HIGH (ref 70–99)
Potassium: 3.8 mmol/L (ref 3.5–5.1)
Sodium: 139 mmol/L (ref 135–145)
Total Bilirubin: 0.9 mg/dL (ref 0.3–1.2)
Total Protein: 7.3 g/dL (ref 6.5–8.1)

## 2021-03-02 LAB — CBC
HCT: 38.8 % — ABNORMAL LOW (ref 39.0–52.0)
Hemoglobin: 12.9 g/dL — ABNORMAL LOW (ref 13.0–17.0)
MCH: 28.9 pg (ref 26.0–34.0)
MCHC: 33.2 g/dL (ref 30.0–36.0)
MCV: 87 fL (ref 80.0–100.0)
Platelets: 217 10*3/uL (ref 150–400)
RBC: 4.46 MIL/uL (ref 4.22–5.81)
RDW: 13.2 % (ref 11.5–15.5)
WBC: 4.2 10*3/uL (ref 4.0–10.5)
nRBC: 0 % (ref 0.0–0.2)

## 2021-03-02 MED ORDER — IOHEXOL 300 MG/ML  SOLN
100.0000 mL | Freq: Once | INTRAMUSCULAR | Status: AC | PRN
  Administered 2021-03-02: 100 mL via INTRAVENOUS

## 2021-03-02 MED ORDER — DICYCLOMINE HCL 10 MG PO CAPS
10.0000 mg | ORAL_CAPSULE | Freq: Once | ORAL | Status: AC
  Administered 2021-03-02: 10 mg via ORAL
  Filled 2021-03-02: qty 1

## 2021-03-02 MED ORDER — ONDANSETRON 4 MG PO TBDP
4.0000 mg | ORAL_TABLET | Freq: Once | ORAL | Status: AC
  Administered 2021-03-02: 4 mg via ORAL
  Filled 2021-03-02: qty 1

## 2021-03-02 MED ORDER — DICYCLOMINE HCL 20 MG PO TABS: 20.0000 mg | ORAL_TABLET | Freq: Two times a day (BID) | ORAL | 0 refills | Status: DC | PRN

## 2021-03-02 MED ORDER — ONDANSETRON 4 MG PO TBDP: 4.0000 mg | ORAL_TABLET | Freq: Three times a day (TID) | ORAL | 0 refills | Status: DC | PRN

## 2021-03-02 NOTE — ED Notes (Signed)
DC instructions reviewed with pt. PT verbalized understanding. PT DC °

## 2021-03-02 NOTE — ED Triage Notes (Signed)
Pt presents today with c/o of abd pain x 3 days. Denies n/v/d.

## 2021-03-02 NOTE — ED Provider Notes (Signed)
[place]    CSN: [id number] Arrival date & time: 03/02/21  1003      History   Chief Complaint Chief Complaint  Patient presents with  . Abdominal Pain    HPI Antonio Johnson is a 60 y.o. male presenting with abdominal pian x3 days.  This patient is a poor historian and is not sure if he has history of gastrointestinal disease, but I do see Pepcid and Bentyl in chart review so I suspect he has diagnosis of GERD.  Today he describes 3 days of generalized abdominal pain that is worse in the left upper quadrant.  States this is intermittent, sharp, and crampy.  When the pain is present it is a 10 out of 10.  Also endorses some nausea but no vomiting.  Bowel movements are regular and last one was this morning, he is still passing gas.  Denies chest pain, back pain, urinary symptoms, fever/chills, URI symptoms.  HPI  Past Medical History:  Diagnosis Date  . Diabetes mellitus without complication ([place])   . Dvt femoral (deep venous thrombosis) ([facility])     There are no problems to display for this patient.   History reviewed. No pertinent surgical history.     Home Medications    Prior to Admission medications   Medication Sig Start Date End Date Taking? Authorizing Provider  amLODipine (NORVASC) 5 MG tablet Take 1 tablet (5 mg total) by mouth daily. 11/26/19  Yes Dorie Rank, MD  metFORMIN (GLUCOPHAGE-XR) 500 MG 24 hr tablet Take 1,000 mg by mouth every evening.  12/06/18  Yes [provider]  XARELTO 20 MG TABS tablet Take 20 mg by mouth daily. 12/06/18  Yes [provider]  albuterol (PROAIR HFA) 108 (90 Base) MCG/ACT inhaler Inhale 1-2 puffs into the lungs every 6 (six) hours as needed for wheezing or shortness of breath. 02/04/19   Zigmund Gottron, NP  benzonatate (TESSALON) 100 MG capsule Take 1 capsule (100 mg total) by mouth every 8 (eight) hours. Patient not taking: Reported on 11/26/2019 02/11/19   Loura Halt A, NP  cetirizine (ZYRTEC) 10 MG tablet  Take 1 tablet (10 mg total) by mouth daily. Patient not taking: Reported on 11/26/2019 02/04/19   Augusto Gamble B, NP  diclofenac sodium (VOLTAREN) 1 % GEL Apply 2 g topically 3 (three) times daily as needed. Patient not taking: Reported on 11/26/2019 07/30/18   Duffy Bruce, MD  dicyclomine (BENTYL) 20 MG tablet Take 1 tablet (20 mg total) by mouth 2 (two) times daily. Patient not taking: Reported on 09/03/2016 04/01/16   Waynetta Pean, PA-C  famotidine (PEPCID) 20 MG tablet Take 1 tablet (20 mg total) by mouth 2 (two) times daily. Patient taking differently: Take 20 mg by mouth daily.  09/02/16   Kirichenko, Lahoma Rocker, PA-C  HYDROcodone-acetaminophen (NORCO/VICODIN) 5-325 MG tablet Take 1-2 tablets by mouth every 6 (six) hours as needed for moderate pain or severe pain. Patient not taking: Reported on 07/30/2018 04/22/18   Duffy Bruce, MD  ibuprofen (ADVIL) 200 MG tablet Take 800 mg by mouth every 6 (six) hours as needed for headache.    [provider]  loperamide (IMODIUM) 2 MG capsule Take 1 capsule (2 mg total) by mouth 2 (two) times daily as needed for diarrhea or loose stools. Patient not taking: Reported on 07/30/2018 09/03/16   Tenna Delaine D, PA-C  Multiple Vitamin (MULITIVITAMIN WITH MINERALS) TABS Take 1 tablet by mouth daily.    [provider]  naproxen (  NAPROSYN) 500 MG tablet Take 1 tablet (500 mg total) by mouth 2 (two) times daily. 02/19/21   Pearson Forster, NP  ondansetron (ZOFRAN ODT) 4 MG disintegrating tablet Take 1 tablet (4 mg total) by mouth every 8 (eight) hours as needed for nausea or vomiting. Patient not taking: Reported on 09/03/2016 04/01/16   Waynetta Pean, PA-C  ondansetron (ZOFRAN ODT) 8 MG disintegrating tablet Take 1 tablet (8 mg total) by mouth every 8 (eight) hours as needed for nausea or vomiting. Patient not taking: Reported on 09/03/2016 09/02/16   Jeannett Senior, PA-C  Rivaroxaban 15 & 20 MG TBPK Take as directed: Start with  one 15mg  tablet by mouth twice a day with food.On Day 22, switch to one 20mg  tablet once a day with food. Patient not taking: Reported on 11/26/2019 07/30/18   Duffy Bruce, MD  tiZANidine (ZANAFLEX) 4 MG tablet Take 1 tablet (4 mg total) by mouth every 6 (six) hours as needed for muscle spasms. 02/19/21   Pearson Forster, NP    Family History Family History  Family history unknown: Yes    Social History Social History   Tobacco Use  . Smoking status: Never Smoker  . Smokeless tobacco: Never Used  Vaping Use  . Vaping Use: Never used  Substance Use Topics  . Alcohol use: No  . Drug use: No     Allergies   Patient has no known allergies.   Review of Systems Review of Systems  Constitutional: Negative for appetite change, chills, diaphoresis, fever and unexpected weight change.  HENT: Negative for congestion, ear pain, sinus pressure, sinus pain, sneezing, sore throat and trouble swallowing.   Respiratory: Negative for cough, chest tightness and shortness of breath.   Cardiovascular: Negative for chest pain.  Gastrointestinal: Positive for abdominal pain and nausea. Negative for abdominal distention, anal bleeding, blood in stool, constipation, diarrhea, rectal pain and vomiting.  Genitourinary: Negative for dysuria, flank pain, frequency and urgency.  Musculoskeletal: Negative for back pain and myalgias.  Neurological: Negative for dizziness, light-headedness and headaches.  All other systems reviewed and are negative.    Physical Exam Triage Vital Signs ED Triage Vitals  Enc Vitals Group     BP 03/02/21 1024 (!) 150/96     Pulse Rate 03/02/21 1024 71     Resp 03/02/21 1024 20     Temp 03/02/21 1024 98.3 F (36.8 C)     Temp Source 03/02/21 1024 Oral     SpO2 03/02/21 1024 (!) 71 %     Weight --      Height --      Head Circumference --      Peak Flow --      Pain Score 03/02/21 1020 10     Pain Loc --      Pain Edu? --      Excl. in [place]? --    No data  found.  Updated Vital Signs BP (!) 150/96 (BP Location: Right Arm) Comment: last took BP meds yesterday morning  Pulse 71   Temp 98.3 F (36.8 C) (Oral)   Resp 20   SpO2 (!) 71%   Visual Acuity Right Eye Distance:   Left Eye Distance:   Bilateral Distance:    Right Eye Near:   Left Eye Near:    Bilateral Near:     Physical Exam Vitals reviewed.  Constitutional:      General: He is not in acute distress.    Appearance: Normal appearance. He  is not ill-appearing.  HENT:     Head: Normocephalic and atraumatic.     Mouth/Throat:     Mouth: Mucous membranes are moist.     Comments: Moist mucous membranes Eyes:     Extraocular Movements: Extraocular movements intact.     Pupils: Pupils are equal, round, and reactive to light.  Cardiovascular:     Rate and Rhythm: Normal rate and regular rhythm.     Heart sounds: Normal heart sounds.  Pulmonary:     Effort: Pulmonary effort is normal.     Breath sounds: Normal breath sounds. No wheezing, rhonchi or rales.  Abdominal:     General: Bowel sounds are normal. There is no distension.     Palpations: Abdomen is soft. There is no mass.     Tenderness: There is abdominal tenderness in the right upper quadrant, epigastric area and left upper quadrant. There is no right CVA tenderness, left CVA tenderness, guarding or rebound. Positive signs include Murphy's sign. Negative signs include Rovsing's sign and McBurney's sign.     Comments: Questionably positive Murphy sign. Significant LUQ pain.  Skin:    General: Skin is warm.     Capillary Refill: Capillary refill takes less than 2 seconds.     Comments: Good skin turgor  Neurological:     General: No focal deficit present.     Mental Status: He is alert and oriented to person, place, and time.  Psychiatric:        Mood and Affect: Mood normal.        Behavior: Behavior normal.      UC Treatments / Results  Labs (all labs ordered are listed, but only abnormal results are  displayed) Labs Reviewed  POCT URINALYSIS DIPSTICK, ED / UC - Abnormal; Notable for the following components:      Result Value   Ketones, ur TRACE (*)    Protein, ur 100 (*)    All other components within normal limits    EKG   Radiology No results found.  Procedures Procedures (including critical care time)  Medications Ordered in UC Medications - No data to display  Initial Impression / Assessment and Plan / UC Course  I have reviewed the triage vital signs and the nursing notes.  Pertinent labs & imaging results that were available during my care of the patient were reviewed by me and considered in my medical decision making (see chart for details).     This patient is a 60 year old male presenting with abdominal pain.  This is worst in the left upper quadrant, he rates this as a 10 out of 10 with palpation.  Also with epigastric and right upper quadrant pain.  Patient does appear comfortable at rest.  I suspect this patient has gastritis or GERD, though he is a poor historian and is not sure if he has history of this.  However I cannot exclude pancreatitis or gallbladder disease, and so I am recommending he head to the emergency room for this evaluation, which cannot be performed in the urgent care setting due to abdominal imaging requirement.  He is hemodynamically stable for transfer to the ER and personal vehicle this time.  For hypertension, he has not taken his antihypertensives yet today.  Final Clinical Impressions(s) / UC Diagnoses   Final diagnoses:  Left upper quadrant abdominal pain  Essential hypertension     Discharge Instructions     -Head straight to [facility] emergency room for further evaluation and management of  abdominal pain. -If you develop new symptoms, like dizziness, chest pain or shortness of breath-stop and call 911 immediately.    ED Prescriptions    None     PDMP not reviewed this encounter.   Hazel Sams, PA-C 03/02/21  1049

## 2021-03-02 NOTE — Discharge Instructions (Addendum)
-  Head straight to [facility] emergency room for further evaluation and management of abdominal pain. -If you develop new symptoms, like dizziness, chest pain or shortness of breath-stop and call 911 immediately.

## 2021-03-02 NOTE — ED Notes (Addendum)
Patient in [place].

## 2021-03-02 NOTE — ED Provider Notes (Signed)
[facility] EMERGENCY DEPARTMENT Provider Note   CSN: [id number] Arrival date & time: 03/02/21  1106     History Chief Complaint  Patient presents with  . Abdominal Pain    Antonio Johnson is a 60 y.o. male.  The history is provided by the patient and medical records.  Abdominal Pain  Antonio Johnson is a 60 y.o. male who presents to the Emergency Department complaining of abdominal pain. He presents the emergency department upon referral from urgent care for generalized abdominal pain that started three days ago. Pain is described as mild and occasionally sharp in nature. He has associated nausea. No vomiting, diarrhea, fevers, chest pain, shortness of breath, dysuria. He has a history of diabetes. No prior similar symptoms. No tobacco, alcohol, drug use.    Past Medical History:  Diagnosis Date  . Diabetes mellitus without complication ([place])   . Dvt femoral (deep venous thrombosis) (HCC)     There are no problems to display for this patient.   History reviewed. No pertinent surgical history.     Family History  Family history unknown: Yes    Social History   Tobacco Use  . Smoking status: Never Smoker  . Smokeless tobacco: Never Used  Vaping Use  . Vaping Use: Never used  Substance Use Topics  . Alcohol use: No  . Drug use: No    Home Medications Prior to Admission medications   Medication Sig Start Date End Date Taking? Authorizing Provider  dicyclomine (BENTYL) 20 MG tablet Take 1 tablet (20 mg total) by mouth 2 (two) times daily as needed for spasms. 03/02/21  Yes Quintella Reichert, MD  ondansetron (ZOFRAN ODT) 4 MG disintegrating tablet Take 1 tablet (4 mg total) by mouth every 8 (eight) hours as needed for nausea or vomiting. 03/02/21  Yes Quintella Reichert, MD  albuterol [facility] HFA) 108 [phone] Base) MCG/ACT inhaler Inhale 1-2 puffs into the lungs every 6 (six) hours as needed for wheezing or shortness of breath. 02/04/19   Augusto Gamble B, NP   amLODipine (NORVASC) 5 MG tablet Take 1 tablet (5 mg total) by mouth daily. 11/26/19   Dorie Rank, MD  benzonatate (TESSALON) 100 MG capsule Take 1 capsule (100 mg total) by mouth every 8 (eight) hours. Patient not taking: Reported on 11/26/2019 02/11/19   Loura Halt A, NP  cetirizine (ZYRTEC) 10 MG tablet Take 1 tablet (10 mg total) by mouth daily. Patient not taking: Reported on 11/26/2019 02/04/19   Augusto Gamble B, NP  diclofenac sodium (VOLTAREN) 1 % GEL Apply 2 g topically 3 (three) times daily as needed. Patient not taking: Reported on 11/26/2019 07/30/18   Duffy Bruce, MD  famotidine (PEPCID) 20 MG tablet Take 1 tablet (20 mg total) by mouth 2 (two) times daily. Patient taking differently: Take 20 mg by mouth daily.  09/02/16   Kirichenko, Lahoma Rocker, PA-C  HYDROcodone-acetaminophen (NORCO/VICODIN) 5-325 MG tablet Take 1-2 tablets by mouth every 6 (six) hours as needed for moderate pain or severe pain. Patient not taking: Reported on 07/30/2018 04/22/18   Duffy Bruce, MD  ibuprofen (ADVIL) 200 MG tablet Take 800 mg by mouth every 6 (six) hours as needed for headache.    [provider]  loperamide (IMODIUM) 2 MG capsule Take 1 capsule (2 mg total) by mouth 2 (two) times daily as needed for diarrhea or loose stools. Patient not taking: Reported on 07/30/2018 09/03/16   Tenna Delaine D, PA-C  metFORMIN (GLUCOPHAGE-XR) 500 MG 24 hr  tablet Take 1,000 mg by mouth every evening.  12/06/18   [provider]  Multiple Vitamin (MULITIVITAMIN WITH MINERALS) TABS Take 1 tablet by mouth daily.    [provider]  naproxen (NAPROSYN) 500 MG tablet Take 1 tablet (500 mg total) by mouth 2 (two) times daily. 02/19/21   Pearson Forster, NP  Rivaroxaban 15 & 20 MG TBPK Take as directed: Start with one 15mg  tablet by mouth twice a day with food.On Day 22, switch to one 20mg  tablet once a day with food. Patient not taking: Reported on 11/26/2019 07/30/18   Duffy Bruce, MD   tiZANidine (ZANAFLEX) 4 MG tablet Take 1 tablet (4 mg total) by mouth every 6 (six) hours as needed for muscle spasms. 02/19/21   Pearson Forster, NP  XARELTO 20 MG TABS tablet Take 20 mg by mouth daily. 12/06/18   [provider]    Allergies    Patient has no known allergies.  Review of Systems   Review of Systems  Gastrointestinal: Positive for abdominal pain.  All other systems reviewed and are negative.   Physical Exam Updated Vital Signs BP (!) 148/108   Pulse (!) 52   Temp 98.5 F (36.9 C) (Oral)   Resp 20   SpO2 100%   Physical Exam Vitals and nursing note reviewed.  Constitutional:      Appearance: He is well-developed.  HENT:     Head: Normocephalic and atraumatic.  Cardiovascular:     Rate and Rhythm: Normal rate and regular rhythm.     Heart sounds: No murmur heard.   Pulmonary:     Effort: Pulmonary effort is normal. No respiratory distress.     Breath sounds: Normal breath sounds.  Abdominal:     Palpations: Abdomen is soft.     Tenderness: There is no guarding or rebound.     Comments: Mild generalized abdominal tenderness  Musculoskeletal:        General: No tenderness.  Skin:    General: Skin is warm and dry.  Neurological:     Mental Status: He is alert and oriented to person, place, and time.  Psychiatric:        Behavior: Behavior normal.     ED Results / Procedures / Treatments   Labs (all labs ordered are listed, but only abnormal results are displayed) Labs Reviewed  COMPREHENSIVE METABOLIC PANEL - Abnormal; Notable for the following components:      Result Value   Glucose, Bld 123 (*)    Creatinine, Ser 1.28 (*)    All other components within normal limits  CBC - Abnormal; Notable for the following components:   Hemoglobin 12.9 (*)    HCT 38.8 (*)    All other components within normal limits  LIPASE, BLOOD  URINALYSIS, ROUTINE W REFLEX MICROSCOPIC    EKG None  Radiology CT Abdomen Pelvis W Contrast  Result  Date: 03/02/2021 CLINICAL DATA:  Three days of left lower quadrant abdominal pain. EXAM: CT ABDOMEN AND PELVIS WITH CONTRAST TECHNIQUE: Multidetector CT imaging of the abdomen and pelvis was performed using the standard protocol following bolus administration of intravenous contrast. CONTRAST:  158mL OMNIPAQUE IOHEXOL 300 MG/ML  SOLN COMPARISON:  Same day abdominal ultrasound FINDINGS: Lower chest: No acute abnormality. Normal size heart. No significant pericardial effusion/thickening. Hepatobiliary: Diffuse hepatic steatosis. No suspicious hepatic lesion. Gallbladder is unremarkable. No biliary ductal dilation. Pancreas: Unremarkable. No pancreatic ductal dilatation or surrounding inflammatory changes. Spleen: Normal in size without focal abnormality.  Adrenals/Urinary Tract: Adrenal glands are unremarkable. Kidneys are normal, without renal calculi, focal lesion, or hydronephrosis. Bladder is unremarkable. Stomach/Bowel: Stomach is grossly unremarkable. Normal positioning of the duodenum/ligament of [place]. In left hemiabdomen there is a segment of small bowel which is fluid-filled with wall thickening and adjacent mesenteric stranding for instance on image 37/3 and 47/6. The appendix and terminal ileum are unremarkable. Scattered colonic diverticulosis without findings of acute diverticulitis. Vascular/Lymphatic: Aortic atherosclerosis. No enlarged abdominal or pelvic lymph nodes. Reproductive: Prostate is unremarkable. Other: Small fat containing left inguinal hernia. Musculoskeletal: Multilevel degenerative changes spine. Partial bony ankylosis of the bilateral SI joints. Degenerative changes bilateral hips. Bone island in the L3 vertebral body. IMPRESSION: 1. Segment of fluid-filled small bowel with wall thickening and adjacent mesenteric stranding in the left hemiabdomen, suggestive of enteritis, which may be infectious or inflammatory in etiology. 2. Diffuse hepatic steatosis. 3. Scattered colonic  diverticulosis without findings of acute diverticulitis. 4. Small fat containing left inguinal hernia. 5. Aortic atherosclerosis. Aortic Atherosclerosis (ICD10-I70.0). Electronically Signed   By: Dahlia Bailiff MD   On: 03/02/2021 15:41   US Abdomen Limited RUQ (LIVER/GB)  Result Date: 03/02/2021 CLINICAL DATA:  Right upper quadrant pain for 3 days EXAM: ULTRASOUND ABDOMEN LIMITED RIGHT UPPER QUADRANT COMPARISON:  None. FINDINGS: Gallbladder: No gallstones or wall thickening visualized. No sonographic Murphy sign noted by sonographer. Common bile duct: Diameter: 5 mm Liver: No focal lesion. Diffusely increased parenchymal echogenicity. Portal vein is patent on color Doppler imaging with normal direction of blood flow towards the liver. Other: None. IMPRESSION: Diffuse increased echogenicity of the hepatic parenchyma is a nonspecific indicator of hepatocellular dysfunction, most commonly steatosis. Electronically Signed   By: Miachel Roux M.D.   On: 03/02/2021 12:43    Procedures Procedures   Medications Ordered in ED Medications  ondansetron (ZOFRAN-ODT) disintegrating tablet 4 mg (has no administration in time range)  dicyclomine (BENTYL) capsule 10 mg (has no administration in time range)  iohexol (OMNIPAQUE) 300 MG/ML solution 100 mL (100 mLs Intravenous Contrast Given 03/02/21 1512)    ED Course  I have reviewed the triage vital signs and the nursing notes.  Pertinent labs & imaging results that were available during my care of the patient were reviewed by me and considered in my medical decision making (see chart for details).    MDM Rules/Calculators/A&P                         patient with history of diabetes here for evaluation of three days of abdominal pain. Labs are near his baseline. He has mild generalized abdominal tenderness. Ultrasound with hepatic steatosis, no evidence of cholecystitis. Will obtain CT abdomen pelvis to further evaluate. Records reviewed in epic, patient was  seen in urgent care earlier today and referred to the emergency department. Under vital signs he has a document SPO to of 71%, feel that that this is inaccurate as patient without any hypoxia in the emergency department and denies any shortness of breath.  CT scan returned with possible enteritis. Patient is not having any fevers, diarrhea. Discussed with patient unclear source of symptoms. Presentation is not consistent with ischemic or infectious process at this time. Plan to treat with Zofran, pentyl for symptom control. Discussed PCP and G.I. follow-up as well as return precautions.   Final Clinical Impression(s) / ED Diagnoses Final diagnoses:  RUQ abdominal pain  Enteritis    Rx / DC Orders ED Discharge Orders  Ordered    dicyclomine (BENTYL) 20 MG tablet  2 times daily PRN        03/02/21 1628    ondansetron (ZOFRAN ODT) 4 MG disintegrating tablet  Every 8 hours PRN        03/02/21 1628           Quintella Reichert, MD 03/02/21 [phone]

## 2021-03-02 NOTE — ED Triage Notes (Signed)
C/o generalized abd pain x 3 days with mild nausea.  Denies vomiting and diarrhea.   Sent from [facility].

## 2021-03-04 ENCOUNTER — Encounter: Payer: Self-pay | Admitting: Physician Assistant

## 2021-03-18 ENCOUNTER — Ambulatory Visit: Payer: 59 | Admitting: Physician Assistant

## 2021-07-03 ENCOUNTER — Other Ambulatory Visit: Payer: Self-pay

## 2021-07-03 ENCOUNTER — Ambulatory Visit (HOSPITAL_COMMUNITY)
Admission: EM | Admit: 2021-07-03 | Discharge: 2021-07-03 | Disposition: A | Discharge status: Home / Self Care | Payer: 59 | Attending: Student | Admitting: Student

## 2021-07-03 ENCOUNTER — Encounter (HOSPITAL_COMMUNITY): Payer: Self-pay

## 2021-07-03 DIAGNOSIS — E1159 Type 2 diabetes mellitus with other circulatory complications: Secondary | ICD-10-CM | POA: Diagnosis not present

## 2021-07-03 DIAGNOSIS — Z7901 Long term (current) use of anticoagulants: Secondary | ICD-10-CM

## 2021-07-03 DIAGNOSIS — I10 Essential (primary) hypertension: Secondary | ICD-10-CM

## 2021-07-03 DIAGNOSIS — Z7984 Long term (current) use of oral hypoglycemic drugs: Secondary | ICD-10-CM

## 2021-07-03 DIAGNOSIS — Z86718 Personal history of other venous thrombosis and embolism: Secondary | ICD-10-CM

## 2021-07-03 DIAGNOSIS — E1165 Type 2 diabetes mellitus with hyperglycemia: Secondary | ICD-10-CM | POA: Diagnosis not present

## 2021-07-03 DIAGNOSIS — E1169 Type 2 diabetes mellitus with other specified complication: Secondary | ICD-10-CM

## 2021-07-03 DIAGNOSIS — Z760 Encounter for issue of repeat prescription: Secondary | ICD-10-CM

## 2021-07-03 DIAGNOSIS — Z9114 Patient's other noncompliance with medication regimen: Secondary | ICD-10-CM

## 2021-07-03 DIAGNOSIS — Z79899 Other long term (current) drug therapy: Secondary | ICD-10-CM

## 2021-07-03 LAB — CBG MONITORING, ED: Glucose-Capillary: 281 mg/dL — ABNORMAL HIGH (ref 70–99)

## 2021-07-03 MED ORDER — AMLODIPINE BESYLATE 5 MG PO TABS: 5.0000 mg | ORAL_TABLET | Freq: Every day | ORAL | 1 refills | Status: DC

## 2021-07-03 MED ORDER — METFORMIN HCL ER 500 MG PO TB24: 500.0000 mg | ORAL_TABLET | Freq: Every evening | ORAL | 1 refills | Status: DC

## 2021-07-03 NOTE — ED Triage Notes (Signed)
Pt presents with intermittent dizziness and "sick feeling' in stomach over the past few months. Pt states he has not been able to consistently get medication over past few months.

## 2021-07-03 NOTE — Discharge Instructions (Addendum)
-  I refilled the amlodipine and the metformin, restart this today. -Follow-up with your primary care as directed in 2 months. -Please check your blood pressure at home or at the pharmacy. If this continues to be >140/90, follow-up with your primary care provider for further blood pressure management/ medication titration. If you develop chest pain, shortness of breath, vision changes, the worst headache of your life- head straight to the ED or call 911. -Any symptoms of a clot, including leg pain, leg swelling, shortness of breath, chest pain-head straight to the emergency department or call 911.

## 2021-07-03 NOTE — ED Provider Notes (Signed)
[facility]    CSN: [id number] Arrival date & time: 07/03/21  0854      History   Chief Complaint Chief Complaint  Patient presents with   Abdominal Pain   Dizziness    HPI Antonio Johnson is a 60 y.o. male presenting with intermittent dizziness following eating sugary foods x3 months.  He does have a history of diabetes, has not been taking any medications, including his diabetic medications as directed x8 months due to running out of these.  Also with history of DVT, he is without calf swelling or tenderness, shortness of breath, chest pain today. BP elevated- has not been taking his antihypertensives, but denies CP, headaches, vision changes, SOB, current dizziness. Nausea when stomach is empty without vomiting, diarrhea, abd pain. Denies recent travel, prolonged immobilization, recent surgery, recent trauma, hormone replacement use, history of clots, history of DVT, history of PE, smoking.   HPI  Past Medical History:  Diagnosis Date   Diabetes mellitus without complication ([place])    Dvt femoral (deep venous thrombosis) ([place])     There are no problems to display for this patient.   History reviewed. No pertinent surgical history.     Home Medications    Prior to Admission medications   Medication Sig Start Date End Date Taking? Authorizing Provider  albuterol (PROAIR HFA) 108 (90 Base) MCG/ACT inhaler Inhale 1-2 puffs into the lungs every 6 (six) hours as needed for wheezing or shortness of breath. 02/04/19   Augusto Gamble B, NP  amLODipine (NORVASC) 5 MG tablet Take 1 tablet (5 mg total) by mouth daily. 07/03/21   Hazel Sams, PA-C  benzonatate (TESSALON) 100 MG capsule Take 1 capsule (100 mg total) by mouth every 8 (eight) hours. Patient not taking: Reported on 11/26/2019 02/11/19   Loura Halt A, NP  cetirizine (ZYRTEC) 10 MG tablet Take 1 tablet (10 mg total) by mouth daily. Patient not taking: Reported on 11/26/2019 02/04/19   Augusto Gamble B, NP   diclofenac sodium (VOLTAREN) 1 % GEL Apply 2 g topically 3 (three) times daily as needed. Patient not taking: Reported on 11/26/2019 07/30/18   Duffy Bruce, MD  dicyclomine (BENTYL) 20 MG tablet Take 1 tablet (20 mg total) by mouth 2 (two) times daily as needed for spasms. 03/02/21   Quintella Reichert, MD  famotidine (PEPCID) 20 MG tablet Take 1 tablet (20 mg total) by mouth 2 (two) times daily. Patient taking differently: Take 20 mg by mouth daily.  09/02/16   Kirichenko, Lahoma Rocker, PA-C  HYDROcodone-acetaminophen (NORCO/VICODIN) 5-325 MG tablet Take 1-2 tablets by mouth every 6 (six) hours as needed for moderate pain or severe pain. Patient not taking: Reported on 07/30/2018 04/22/18   Duffy Bruce, MD  ibuprofen (ADVIL) 200 MG tablet Take 800 mg by mouth every 6 (six) hours as needed for headache.    [provider]  loperamide (IMODIUM) 2 MG capsule Take 1 capsule (2 mg total) by mouth 2 (two) times daily as needed for diarrhea or loose stools. Patient not taking: Reported on 07/30/2018 09/03/16   Leonie Douglas, PA-C  metFORMIN (GLUCOPHAGE-XR) 500 MG 24 hr tablet Take 1 tablet (500 mg total) by mouth every evening. 07/03/21   Hazel Sams, PA-C  Multiple Vitamin (MULITIVITAMIN WITH MINERALS) TABS Take 1 tablet by mouth daily.    [provider]  naproxen (NAPROSYN) 500 MG tablet Take 1 tablet (500 mg total) by mouth 2 (two) times daily. 02/19/21   Pearson Forster,  NP  ondansetron (ZOFRAN ODT) 4 MG disintegrating tablet Take 1 tablet (4 mg total) by mouth every 8 (eight) hours as needed for nausea or vomiting. 03/02/21   Quintella Reichert, MD  Rivaroxaban 15 & 20 MG TBPK Take as directed: Start with one '15mg'$  tablet by mouth twice a day with food.On Day 22, switch to one '20mg'$  tablet once a day with food. Patient not taking: Reported on 11/26/2019 07/30/18   Duffy Bruce, MD  tiZANidine (ZANAFLEX) 4 MG tablet Take 1 tablet (4 mg total) by mouth every 6 (six) hours as needed for  muscle spasms. 02/19/21   Pearson Forster, NP  XARELTO 20 MG TABS tablet Take 20 mg by mouth daily. 12/06/18   [provider]    Family History Family History  Family history unknown: Yes    Social History Social History   Tobacco Use   Smoking status: Never   Smokeless tobacco: Never  Vaping Use   Vaping Use: Never used  Substance Use Topics   Alcohol use: No   Drug use: No     Allergies   Patient has no known allergies.   Review of Systems Review of Systems  Constitutional:  Negative for appetite change, chills, fatigue and fever.  HENT:  Negative for congestion, sinus pressure, sore throat, trouble swallowing and voice change.   Eyes:  Negative for photophobia, pain, discharge, redness, itching and visual disturbance.  Respiratory:  Negative for cough, chest tightness and shortness of breath.   Cardiovascular:  Negative for chest pain, palpitations and leg swelling.  Gastrointestinal:  Negative for abdominal pain, constipation, diarrhea, nausea and vomiting.  Genitourinary:  Negative for dysuria, flank pain, frequency and urgency.  Musculoskeletal:  Negative for back pain, gait problem, myalgias, neck pain and neck stiffness.  Neurological:  Positive for dizziness. Negative for tremors, seizures, syncope, facial asymmetry, speech difficulty, weakness, light-headedness, numbness and headaches.  Psychiatric/Behavioral:  Negative for agitation, decreased concentration, dysphoric mood, hallucinations and suicidal ideas. The patient is not nervous/anxious.   All other systems reviewed and are negative.   Physical Exam Triage Vital Signs ED Triage Vitals  Enc Vitals Group     BP 07/03/21 0923 (!) 171/95     Pulse Rate 07/03/21 0923 73     Resp 07/03/21 0923 17     Temp 07/03/21 0923 98.5 F (36.9 C)     Temp Source 07/03/21 0923 Oral     SpO2 07/03/21 0923 97 %     Weight --      Height --      Head Circumference --      Peak Flow --      Pain Score  07/03/21 0925 1     Pain Loc --      Pain Edu? --      Excl. in [place]? --    No data found.  Updated Vital Signs BP (!) 171/95 (BP Location: Left Arm)   Pulse 73   Temp 98.5 F (36.9 C) (Oral)   Resp 17   SpO2 97%   Visual Acuity Right Eye Distance:   Left Eye Distance:   Bilateral Distance:    Right Eye Near:   Left Eye Near:    Bilateral Near:     Physical Exam Vitals reviewed.  Constitutional:      General: He is not in acute distress.    Appearance: Normal appearance. He is not ill-appearing or diaphoretic.  HENT:     Head: Normocephalic and atraumatic.  Cardiovascular:     Rate and Rhythm: Normal rate and regular rhythm.     Heart sounds: Normal heart sounds.  Pulmonary:     Effort: Pulmonary effort is normal.     Breath sounds: Normal breath sounds.  Abdominal:     Tenderness: There is no abdominal tenderness. There is no guarding or rebound. Negative signs include Murphy's sign.  Skin:    General: Skin is warm.  Neurological:     General: No focal deficit present.     Mental Status: He is alert and oriented to person, place, and time.     Comments: CN 2-12 grossly intact, PERRLA, EOMI. Negative rhomberg, pronator drift, fingers to thumb. Strength and sensation intact upper and lower extremities. Gait intact.   Psychiatric:        Mood and Affect: Mood normal.        Behavior: Behavior normal.        Thought Content: Thought content normal.        Judgment: Judgment normal.     UC Treatments / Results  Labs (all labs ordered are listed, but only abnormal results are displayed) Labs Reviewed  CBG MONITORING, ED - Abnormal; Notable for the following components:      Result Value   Glucose-Capillary 281 (*)    All other components within normal limits    EKG   Radiology No results found.  Procedures Procedures (including critical care time)  Medications Ordered in UC Medications - No data to display  Initial Impression / Assessment and Plan /  UC Course  I have reviewed the triage vital signs and the nursing notes.  Pertinent labs & imaging results that were available during my care of the patient were reviewed by me and considered in my medical decision making (see chart for details).     This patient is a very pleasant 60 y.o. year old male presenting with complaint of dizziness after eating or drinking sugary foods. Suspect this is related to not taking any medications for 8 months. Has not taken diabetic medication x8 months- metformin. Has also been off of the amlodipine x8 months. He is hypertensive at 171/95, denies current headaches, vision changes, CP, SOB. History DVT- Xarelto for anticoagulation, has not taken this in 8 months as well. DVT was due to sedentary lifestyle/sitting on wallet per pt; occurred years ago, has not had issues since then. No known clotting disorder.   EKG today with 1st degree AV block, similar to 2017 EKG.  Nonfasting CBG 281.   Refilled amlodipine and metformin. Establish care with PCP in 2 months as scheduled.   STRICT ED return precautions discussed. Patient verbalizes understanding and agreement.   Level 4 given review of past notes and labs, ordering and interpretation of labs today, and prescription drug management.   Final Clinical Impressions(s) / UC Diagnoses   Final diagnoses:  Type 2 diabetes mellitus with other specified complication, without long-term current use of insulin ([place])  Type 2 diabetes mellitus with hyperglycemia, without long-term current use of insulin (HCC)  Essential hypertension  History of DVT in adulthood  Long term current use of anticoagulant therapy  Medication refill  Patient noncompliant with anticoagulant medication     Discharge Instructions      -I refilled the amlodipine and the metformin, restart this today. -Follow-up with your primary care as directed in 2 months. -Please check your blood pressure at home or at the pharmacy. If this  continues to be >140/90, follow-up with  your primary care provider for further blood pressure management/ medication titration. If you develop chest pain, shortness of breath, vision changes, the worst headache of your life- head straight to the ED or call 911. -Any symptoms of a clot, including leg pain, leg swelling, shortness of breath, chest pain-head straight to the emergency department or call 911.     ED Prescriptions     Medication Sig Dispense Auth. Provider   metFORMIN (GLUCOPHAGE-XR) 500 MG 24 hr tablet Take 1 tablet (500 mg total) by mouth every evening. 30 tablet Hazel Sams, PA-C   amLODipine (NORVASC) 5 MG tablet Take 1 tablet (5 mg total) by mouth daily. 30 tablet Hazel Sams, PA-C      PDMP not reviewed this encounter.   Hazel Sams, PA-C 07/03/21 1047

## 2021-08-16 ENCOUNTER — Other Ambulatory Visit: Payer: Self-pay

## 2021-08-16 ENCOUNTER — Encounter: Payer: Self-pay | Admitting: Gastroenterology

## 2021-08-16 ENCOUNTER — Ambulatory Visit (AMBULATORY_SURGERY_CENTER): Payer: 59

## 2021-08-16 VITALS — Ht 78.0 in | Wt 253.0 lb

## 2021-08-16 DIAGNOSIS — Z1211 Encounter for screening for malignant neoplasm of colon: Secondary | ICD-10-CM

## 2021-08-16 MED ORDER — PEG 3350-KCL-NA BICARB-NACL 420 G PO SOLR: 4000.0000 mL | Freq: Once | ORAL | 0 refills | Status: AC

## 2021-08-16 NOTE — Progress Notes (Signed)
Denies allergies to eggs or soy products. Denies complication of anesthesia or sedation. Denies use of weight loss medication. Denies use of O2.   Emmi instructions given for colonoscopy.   Patient is going to pick up instructions on 08/19/21 at the third floor desk.

## 2021-08-22 ENCOUNTER — Ambulatory Visit (AMBULATORY_SURGERY_CENTER): Payer: 59 | Admitting: Gastroenterology

## 2021-08-22 ENCOUNTER — Other Ambulatory Visit: Payer: Self-pay

## 2021-08-22 ENCOUNTER — Encounter: Payer: Self-pay | Admitting: Gastroenterology

## 2021-08-22 VITALS — BP 139/82 | HR 62 | Temp 98.0°F | Resp 12 | Ht 78.0 in | Wt 253.0 lb

## 2021-08-22 DIAGNOSIS — D125 Benign neoplasm of sigmoid colon: Secondary | ICD-10-CM

## 2021-08-22 DIAGNOSIS — D122 Benign neoplasm of ascending colon: Secondary | ICD-10-CM

## 2021-08-22 DIAGNOSIS — Z1211 Encounter for screening for malignant neoplasm of colon: Secondary | ICD-10-CM | POA: Diagnosis not present

## 2021-08-22 DIAGNOSIS — D120 Benign neoplasm of cecum: Secondary | ICD-10-CM

## 2021-08-22 DIAGNOSIS — D124 Benign neoplasm of descending colon: Secondary | ICD-10-CM | POA: Diagnosis not present

## 2021-08-22 MED ORDER — SODIUM CHLORIDE 0.9 % IV SOLN: 500.0000 mL | Freq: Once | INTRAVENOUS | Status: AC

## 2021-08-22 NOTE — Progress Notes (Signed)
No problems noted in the recovery room. Antonio Johnson   Pt's wife left to go to work before pt was discharged.  She heard what dr. Silverio Decamp said and I went over her discharge instructions with pt and her.  Pt's friend, Saralyn Pilar will drive him home. Antonio Johnson

## 2021-08-22 NOTE — Progress Notes (Signed)
[facility] History and Physical   Primary Care Physician:  [facility] @ [place]   Reason for Procedure:  Colorectal cancer screening  Plan:    Screening colonoscopy with possible interventions as needed     HPI: Antonio Johnson is a very pleasant 59 y.o. male here for screening colonoscopy. Denies any nausea, vomiting, abdominal pain, melena or bright red blood per rectum  The risks and benefits as well as alternatives of endoscopic procedure(s) have been discussed and reviewed. All questions answered. The patient agrees to proceed.    Past Medical History:  Diagnosis Date   Allergy    Clotting disorder ([place])    Diabetes mellitus without complication ([place])    Dvt femoral (deep venous thrombosis) ([place])    Hypertension     Past Surgical History:  Procedure Laterality Date   WISDOM TOOTH EXTRACTION      Prior to Admission medications   Medication Sig Start Date End Date Taking? Authorizing Provider  amLODipine (NORVASC) 5 MG tablet Take 1 tablet (5 mg total) by mouth daily. 07/03/21  Yes Hazel Sams, PA-C  metFORMIN (GLUCOPHAGE-XR) 500 MG 24 hr tablet Take 1 tablet (500 mg total) by mouth every evening. 07/03/21  Yes Hazel Sams, PA-C  Multiple Vitamin (MULITIVITAMIN WITH MINERALS) TABS Take 1 tablet by mouth daily.   Yes [provider]  benzonatate (TESSALON) 100 MG capsule Take 1 capsule (100 mg total) by mouth every 8 (eight) hours. Patient not taking: No sig reported 02/11/19   Loura Halt A, NP  cetirizine (ZYRTEC) 10 MG tablet Take 1 tablet (10 mg total) by mouth daily. Patient not taking: No sig reported 02/04/19   Augusto Gamble B, NP  dicyclomine (BENTYL) 20 MG tablet Take 1 tablet (20 mg total) by mouth 2 (two) times daily as needed for spasms. Patient not taking: Reported on 08/22/2021 03/02/21   Quintella Reichert, MD  HYDROcodone-acetaminophen (NORCO/VICODIN) 5-325 MG tablet Take 1-2 tablets by mouth every 6 (six) hours  as needed for moderate pain or severe pain. Patient not taking: No sig reported 04/22/18   Duffy Bruce, MD  ibuprofen (ADVIL) 200 MG tablet Take 800 mg by mouth every 6 (six) hours as needed for headache.    [provider]  naproxen (NAPROSYN) 500 MG tablet Take 1 tablet (500 mg total) by mouth 2 (two) times daily. Patient not taking: No sig reported 02/19/21   Pearson Forster, NP  ondansetron (ZOFRAN ODT) 4 MG disintegrating tablet Take 1 tablet (4 mg total) by mouth every 8 (eight) hours as needed for nausea or vomiting. Patient not taking: No sig reported 03/02/21   Quintella Reichert, MD  tiZANidine (ZANAFLEX) 4 MG tablet Take 1 tablet (4 mg total) by mouth every 6 (six) hours as needed for muscle spasms. Patient not taking: Reported on 08/22/2021 02/19/21   Pearson Forster, NP    Current Outpatient Medications  Medication Sig Dispense Refill   amLODipine (NORVASC) 5 MG tablet Take 1 tablet (5 mg total) by mouth daily. 30 tablet 1   metFORMIN (GLUCOPHAGE-XR) 500 MG 24 hr tablet Take 1 tablet (500 mg total) by mouth every evening. 30 tablet 1   Multiple Vitamin (MULITIVITAMIN WITH MINERALS) TABS Take 1 tablet by mouth daily.     benzonatate (TESSALON) 100 MG capsule Take 1 capsule (100 mg total) by mouth every 8 (eight) hours. (Patient not taking: No sig reported) 21 capsule 0   cetirizine (ZYRTEC) 10 MG tablet Take 1  tablet (10 mg total) by mouth daily. (Patient not taking: No sig reported) 30 tablet 0   dicyclomine (BENTYL) 20 MG tablet Take 1 tablet (20 mg total) by mouth 2 (two) times daily as needed for spasms. (Patient not taking: Reported on 08/22/2021) 12 tablet 0   HYDROcodone-acetaminophen (NORCO/VICODIN) 5-325 MG tablet Take 1-2 tablets by mouth every 6 (six) hours as needed for moderate pain or severe pain. (Patient not taking: No sig reported) 10 tablet 0   ibuprofen (ADVIL) 200 MG tablet Take 800 mg by mouth every 6 (six) hours as needed for headache.     naproxen  (NAPROSYN) 500 MG tablet Take 1 tablet (500 mg total) by mouth 2 (two) times daily. (Patient not taking: No sig reported) 30 tablet 0   ondansetron (ZOFRAN ODT) 4 MG disintegrating tablet Take 1 tablet (4 mg total) by mouth every 8 (eight) hours as needed for nausea or vomiting. (Patient not taking: No sig reported) 12 tablet 0   tiZANidine (ZANAFLEX) 4 MG tablet Take 1 tablet (4 mg total) by mouth every 6 (six) hours as needed for muscle spasms. (Patient not taking: Reported on 08/22/2021) 30 tablet 0   Current Facility-Administered Medications  Medication Dose Route Frequency Provider Last Rate Last Admin   0.9 %  sodium chloride infusion  500 mL Intravenous Once Mauri Pole, MD        Allergies as of 08/22/2021   (No Known Allergies)    Family History  Problem Relation Age of Onset   Rectal cancer Maternal Uncle    Colon cancer Maternal Uncle    Colon cancer Cousin    Esophageal cancer Neg Hx    Stomach cancer Neg Hx     Social History   Socioeconomic History   Marital status: Married    Spouse name: Not on file   Number of children: Not on file   Years of education: Not on file   Highest education level: Not on file  Occupational History   Not on file  Tobacco Use   Smoking status: Never   Smokeless tobacco: Never  Vaping Use   Vaping Use: Never used  Substance and Sexual Activity   Alcohol use: No   Drug use: No   Sexual activity: Not on file  Other Topics Concern   Not on file  Social History Narrative   Not on file   Social Determinants of Health   Financial Resource Strain: Not on file  Food Insecurity: Not on file  Transportation Needs: Not on file  Physical Activity: Not on file  Stress: Not on file  Social Connections: Not on file  Intimate Partner Violence: Not on file    Review of Systems:  All other review of systems negative except as mentioned in the HPI.  Physical Exam: Vital signs in last 24 hours: BP (!) 158/88   Pulse 69    Temp 98 F (36.7 C) (Skin)   Ht 6\' 6"  (1.981 m)   Wt 253 lb (114.8 kg)   SpO2 96%   BMI 29.24 kg/m     General:   Alert, NAD Lungs:  Clear .   Heart:  Regular rate and rhythm Abdomen:  Soft, nontender and nondistended. Neuro/Psych:  Alert and cooperative. Normal mood and affect. A and O x 3  Reviewed labs, radiology imaging, old records and pertinent past GI work up  Patient is appropriate for planned procedure(s) and anesthesia in an ambulatory setting   K. Denzil Magnuson ,  MD [phone]

## 2021-08-22 NOTE — Progress Notes (Signed)
A/ox3, pleased with MAC, report to RN 

## 2021-08-22 NOTE — Progress Notes (Signed)
Called to room to assist during endoscopic procedure.  Patient ID and intended procedure confirmed with present staff. Received instructions for my participation in the procedure from the performing physician.  

## 2021-08-22 NOTE — Progress Notes (Signed)
Pt's states no medical or surgical changes since previsit or office visit. VS assessed by C.W 

## 2021-08-22 NOTE — Op Note (Signed)
[facility] Patient Name: Antonio Johnson Procedure Date: 08/22/2021 9:10 AM MRN: [id number] Endoscopist: Mauri Pole , MD Age: 60 Referring MD:  Date of Birth: November 19, 1960 Gender: Male Account #: [id number] Procedure:                Colonoscopy Indications:              Screening for colorectal malignant neoplasm Medicines:                Monitored Anesthesia Care Procedure:                Pre-Anesthesia Assessment:                           - Prior to the procedure, a History and Physical                            was performed, and patient medications and                            allergies were reviewed. The patient's tolerance of                            previous anesthesia was also reviewed. The risks                            and benefits of the procedure and the sedation                            options and risks were discussed with the patient.                            All questions were answered, and informed consent                            was obtained. Prior Anticoagulants: The patient has                            taken no previous anticoagulant or antiplatelet                            agents. ASA Grade Assessment: II - A patient with                            mild systemic disease. After reviewing the risks                            and benefits, the patient was deemed in                            satisfactory condition to undergo the procedure.                           After obtaining informed consent, the colonoscope  was passed under direct vision. Throughout the                            procedure, the patient's blood pressure, pulse, and                            oxygen saturations were monitored continuously. The                            Olympus PCF-H190DL (#[id number]) Colonoscope was                            introduced through the anus and advanced to the the                            cecum,  identified by appendiceal orifice and                            ileocecal valve. The colonoscopy was performed                            without difficulty. The patient tolerated the                            procedure well. The quality of the bowel                            preparation was adequate. The ileocecal valve,                            appendiceal orifice, and rectum were photographed. Scope In: 9:23:37 AM Scope Out: 9:42:29 AM Scope Withdrawal Time: 0 hours 13 minutes 49 seconds  Total Procedure Duration: 0 hours 18 minutes 52 seconds  Findings:                 The perianal and digital rectal examinations were                            normal.                           Two semi-pedunculated polyps were found in the                            ascending colon and cecum. The polyps were 7 to 15                            mm in size. These polyps were removed with a hot                            snare. Resection and retrieval were complete.                           Three sessile polyps were found in the sigmoid  colon and descending colon. The polyps were 5 to 7                            mm in size. These polyps were removed with a cold                            snare. Resection and retrieval were complete.                           A few small-mouthed diverticula were found in the                            sigmoid colon and ascending colon.                           Non-bleeding external and internal hemorrhoids were                            found during retroflexion. The hemorrhoids were                            medium-sized. Complications:            No immediate complications. Estimated Blood Loss:     Estimated blood loss was minimal. Impression:               - Two 7 to 15 mm polyps in the ascending colon and                            in the cecum, removed with a hot snare. Resected                            and retrieved.                            - Three 5 to 7 mm polyps in the sigmoid colon and                            in the descending colon, removed with a cold snare.                            Resected and retrieved.                           - Diverticulosis in the sigmoid colon and in the                            ascending colon.                           - Non-bleeding external and internal hemorrhoids. Recommendation:           - Patient has a contact number available for  emergencies. The signs and symptoms of potential                            delayed complications were discussed with the                            patient. Return to normal activities tomorrow.                            Written discharge instructions were provided to the                            patient.                           - Resume previous diet.                           - Continue present medications.                           - Await pathology results.                           - Repeat colonoscopy in 3 years for surveillance                            based on pathology results.                           - No ibuprofen, naproxen, or other non-steroidal                            anti-inflammatory drugs for 2 weeks. Mauri Pole, MD 08/22/2021 9:48:03 AM This report has been signed electronically.

## 2021-08-22 NOTE — Patient Instructions (Addendum)
Handouts were given to your care partner on polyps, diverticulosis, and hemorrhoids. You sugar was 116 in the recovery room. NO ASPIRIN, ASPIRIN CONTAINING PRODUCTS (BC OR GOODY POWDERS) OR NSAIDS (IBUPROFEN, ADVIL, ALEVE, NAPROXEN, AND MOTRIN) FOR 2 weeks; TYLENOL IS OK TO TAKE. You may resume your other current medications today. Await biopsy results.  May take 1-3 weeks to receive pathology results. Please call if any questions or concerns.     YOU HAD AN ENDOSCOPIC PROCEDURE TODAY AT [facility]:   Refer to the procedure report that was given to you for any specific questions about what was found during the examination.  If the procedure report does not answer your questions, please call your gastroenterologist to clarify.  If you requested that your care partner not be given the details of your procedure findings, then the procedure report has been included in a sealed envelope for you to review at your convenience later.  YOU SHOULD EXPECT: Some feelings of bloating in the abdomen. Passage of more gas than usual.  Walking can help get rid of the air that was put into your GI tract during the procedure and reduce the bloating. If you had a lower endoscopy (such as a colonoscopy or flexible sigmoidoscopy) you may notice spotting of blood in your stool or on the toilet paper. If you underwent a bowel prep for your procedure, you may not have a normal bowel movement for a few days.  Please Note:  You might notice some irritation and congestion in your nose or some drainage.  This is from the oxygen used during your procedure.  There is no need for concern and it should clear up in a day or so.  SYMPTOMS TO REPORT IMMEDIATELY:  Following lower endoscopy (colonoscopy or flexible sigmoidoscopy):  Excessive amounts of blood in the stool  Significant tenderness or worsening of abdominal pains  Swelling of the abdomen that is new, acute  Fever of 100F or higher   For urgent or  emergent issues, a gastroenterologist can be reached at any hour by calling [phone]. Do not use MyChart messaging for urgent concerns.    DIET:  We do recommend a small meal at first, but then you may proceed to your regular diet.  Drink plenty of fluids but you should avoid alcoholic beverages for 24 hours.  ACTIVITY:  You should plan to take it easy for the rest of today and you should NOT DRIVE or use heavy machinery until tomorrow (because of the sedation medicines used during the test).    FOLLOW UP: Our staff will call the number listed on your records 48-72 hours following your procedure to check on you and address any questions or concerns that you may have regarding the information given to you following your procedure. If we do not reach you, we will leave a message.  We will attempt to reach you two times.  During this call, we will ask if you have developed any symptoms of COVID 19. If you develop any symptoms (ie: fever, flu-like symptoms, shortness of breath, cough etc.) before then, please call [phone].  If you test positive for Covid 19 in the 2 weeks post procedure, please call and report this information to [place].    If any biopsies were taken you will be contacted by phone or by letter within the next 1-3 weeks.  Please call us at [phone] if you have not heard about the biopsies in 3 weeks.  SIGNATURES/CONFIDENTIALITY: You and/or your care partner have signed paperwork which will be entered into your electronic medical record.  These signatures attest to the fact that that the information above on your After Visit Summary has been reviewed and is understood.  Full responsibility of the confidentiality of this discharge information lies with you and/or your care-partner.

## 2021-08-26 ENCOUNTER — Telehealth: Payer: Self-pay

## 2021-08-26 NOTE — Telephone Encounter (Signed)
  Follow up Call-  Call back number 08/22/2021  Post procedure Call Back phone  # [phone]  Permission to leave phone message Yes  Some recent data might be hidden     Patient questions:  Do you have a fever, pain , or abdominal swelling? No. Pain Score  0 *  Have you tolerated food without any problems? Yes.    Have you been able to return to your normal activities? Yes.    Do you have any questions about your discharge instructions: Diet   No. Medications  No. Follow up visit  No.  Do you have questions or concerns about your Care? No.  Actions: * If pain score is 4 or above: No action needed, pain <4.

## 2021-08-26 NOTE — Telephone Encounter (Signed)
Left message on follow up call. 

## 2021-09-06 ENCOUNTER — Encounter: Payer: Self-pay | Admitting: Gastroenterology

## 2022-03-12 ENCOUNTER — Ambulatory Visit (HOSPITAL_COMMUNITY)
Admission: EM | Admit: 2022-03-12 | Discharge: 2022-03-12 | Disposition: A | Discharge status: Home / Self Care | Payer: 59 | Attending: Family Medicine | Admitting: Family Medicine

## 2022-03-12 ENCOUNTER — Other Ambulatory Visit: Payer: Self-pay

## 2022-03-12 ENCOUNTER — Encounter (HOSPITAL_COMMUNITY): Payer: Self-pay | Admitting: Emergency Medicine

## 2022-03-12 DIAGNOSIS — R1084 Generalized abdominal pain: Secondary | ICD-10-CM

## 2022-03-12 DIAGNOSIS — R112 Nausea with vomiting, unspecified: Secondary | ICD-10-CM

## 2022-03-12 DIAGNOSIS — R197 Diarrhea, unspecified: Secondary | ICD-10-CM | POA: Diagnosis not present

## 2022-03-12 MED ORDER — ONDANSETRON HCL 4 MG/2ML IJ SOLN
INTRAMUSCULAR | Status: AC
  Filled 2022-03-12: qty 2

## 2022-03-12 MED ORDER — ONDANSETRON HCL 4 MG/2ML IJ SOLN
4.0000 mg | Freq: Once | INTRAMUSCULAR | Status: AC
  Administered 2022-03-12: 4 mg via INTRAMUSCULAR

## 2022-03-12 MED ORDER — ONDANSETRON HCL 4 MG PO TABS: 4.0000 mg | ORAL_TABLET | Freq: Four times a day (QID) | ORAL | 0 refills | Status: DC

## 2022-03-12 NOTE — ED Triage Notes (Signed)
Abdominal pain, vomiting and diarrhea since Tuesday.  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  3 episodes of vomiting today.  4 episodes of diarrhea today ?

## 2022-03-12 NOTE — Discharge Instructions (Addendum)
You were seen today for nausea, vomiting and diarrhea.  ?I have given you a shot of zofran in the office today to help with vomiting.  ?I have sent a script to your pharmacy for this as well.  You next dose will be about 6pm.  ?Please drink small amounts of clear fluids such as sprite, 7-up and ginger ale.  ?If you continue to not keep anything down despite medication and feeling dehydrated, then please go to the ER fur further evaluation and possible IV fluids.  ?

## 2022-03-12 NOTE — ED Provider Notes (Signed)
?[facility] ? ? ? ?CSN: [id number] ?Arrival date & time: 03/12/22  1030 ? ? ?  ? ?History   ?Chief Complaint ?Chief Complaint  ?Patient presents with  ? Abdominal Pain  ? ? ?HPI ?Antonio Johnson is a 61 y.o. male.  ? ?Patient is here for abd pain, n/v/diarrhea since yesterday.  It had him up most of the night.  Pain at the whole abdomen, centrally.  ?No fevers.  ?He has not been able to keep anything down since this started.  ?He is still peeing.  ? ?Past Medical History:  ?Diagnosis Date  ? Allergy   ? Clotting disorder ([place])   ? Diabetes mellitus without complication ([place])   ? Dvt femoral (deep venous thrombosis) ([place])   ? Hypertension   ? ? ?There are no problems to display for this patient. ? ? ?Past Surgical History:  ?Procedure Laterality Date  ? WISDOM TOOTH EXTRACTION    ? ? ? ? ? ?Home Medications   ? ?Prior to Admission medications   ?Medication Sig Start Date End Date Taking? Authorizing Provider  ?amLODipine (NORVASC) 5 MG tablet Take 1 tablet (5 mg total) by mouth daily. 07/03/21   Hazel Sams, PA-C  ?ibuprofen (ADVIL) 200 MG tablet Take 800 mg by mouth every 6 (six) hours as needed for headache.    [provider]  ?metFORMIN (GLUCOPHAGE-XR) 500 MG 24 hr tablet Take 1 tablet (500 mg total) by mouth every evening. 07/03/21   Hazel Sams, PA-C  ?Multiple Vitamin (MULITIVITAMIN WITH MINERALS) TABS Take 1 tablet by mouth daily.    [provider]  ? ? ?Family History ?Family History  ?Problem Relation Age of Onset  ? Rectal cancer Maternal Uncle   ? Colon cancer Maternal Uncle   ? Colon cancer Cousin   ? Esophageal cancer Neg Hx   ? Stomach cancer Neg Hx   ? ? ?Social History ?Social History  ? ?Tobacco Use  ? Smoking status: Never  ? Smokeless tobacco: Never  ?Vaping Use  ? Vaping Use: Never used  ?Substance Use Topics  ? Alcohol use: No  ? Drug use: No  ? ? ? ?Allergies   ?Patient has no known allergies. ? ? ?Review of Systems ?Review of Systems  ?Constitutional:  Negative  for fever.  ?HENT: Negative.    ?Respiratory: Negative.    ?Cardiovascular: Negative.   ?Gastrointestinal:  Positive for abdominal pain, diarrhea, nausea and vomiting.  ?Genitourinary: Negative.   ?Musculoskeletal: Negative.   ?Skin: Negative.   ? ? ?Physical Exam ?Triage Vital Signs ?ED Triage Vitals  ?Enc Vitals Group  ?   BP 03/12/22 1159 (!) 173/89  ?   Pulse Rate 03/12/22 1159 64  ?   Resp 03/12/22 1159 20  ?   Temp 03/12/22 1159 98.9 ?F (37.2 ?C)  ?   Temp Source 03/12/22 1159 Oral  ?   SpO2 03/12/22 1159 98 %  ?   Weight --   ?   Height --   ?   Head Circumference --   ?   Peak Flow --   ?   Pain Score 03/12/22 1157 7  ?   Pain Loc --   ?   Pain Edu? --   ?   Excl. in [place]? --   ? ?No data found. ? ?Updated Vital Signs ?BP (!) 173/89 (BP Location: Left Arm) Comment (BP Location): large cuff  Pulse 64   Temp 98.9 ?F (37.2 ?C) (Oral)  Resp 20   SpO2 98%  ? ?Visual Acuity ?Right Eye Distance:   ?Left Eye Distance:   ?Bilateral Distance:   ? ?Right Eye Near:   ?Left Eye Near:    ?Bilateral Near:    ? ?Physical Exam ?Constitutional:   ?   General: He is not in acute distress. ?   Appearance: He is well-developed. He is not ill-appearing.  ?HENT:  ?   Head: Normocephalic and atraumatic.  ?Cardiovascular:  ?   Rate and Rhythm: Normal rate and regular rhythm.  ?Pulmonary:  ?   Effort: Pulmonary effort is normal.  ?   Breath sounds: Normal breath sounds.  ?Abdominal:  ?   General: Bowel sounds are normal.  ?   Palpations: Abdomen is soft.  ?   Tenderness: There is no abdominal tenderness.  ?Skin: ?   General: Skin is warm.  ?Neurological:  ?   General: No focal deficit present.  ?   Mental Status: He is alert.  ?Psychiatric:     ?   Mood and Affect: Mood normal.  ? ? ? ?UC Treatments / Results  ?Labs ?(all labs ordered are listed, but only abnormal results are displayed) ?Labs Reviewed - No data to display ? ?EKG ? ? ?Radiology ?No results found. ? ?Procedures ?Procedures (including critical care  time) ? ?Medications Ordered in UC ?Medications  ?ondansetron (ZOFRAN) injection 4 mg (has no administration in time range)  ? ? ?Initial Impression / Assessment and Plan / UC Course  ?I have reviewed the triage vital signs and the nursing notes. ? ?Pertinent labs & imaging results that were available during my care of the patient were reviewed by me and considered in my medical decision making (see chart for details). ? ?  ?Final Clinical Impressions(s) / UC Diagnoses  ? ?Final diagnoses:  ?Generalized abdominal pain  ?Nausea vomiting and diarrhea  ? ? ? ?Discharge Instructions   ? ?  ?You were seen today for nausea, vomiting and diarrhea.  ?I have given you a shot of zofran in the office today to help with vomiting.  ?I have sent a script to your pharmacy for this as well.  You next dose will be about 6pm.  ?Please drink small amounts of clear fluids such as sprite, 7-up and ginger ale.  ?If you continue to not keep anything down despite medication and feeling dehydrated, then please go to the ER fur further evaluation and possible IV fluids.  ? ? ? ?ED Prescriptions   ? ? Medication Sig Dispense Auth. Provider  ? ondansetron (ZOFRAN) 4 MG tablet Take 1 tablet (4 mg total) by mouth every 6 (six) hours. 12 tablet Rondel Oh, MD  ? ?  ? ?PDMP not reviewed this encounter. ?  ?Rondel Oh, MD ?03/12/22 1224 ? ?

## 2022-11-06 IMAGING — US US ABDOMEN LIMITED RUQ/ASCITES
1 series · 14 of 25 positions shown · non-contrast
Comparison: None.

CLINICAL DATA: Right upper quadrant pain for 3 days

EXAM:
ULTRASOUND ABDOMEN LIMITED RIGHT UPPER QUADRANT

[Series 1: us abdomen limited ruq (liver/gb) · 14 of 56 slices shown]
[im 1/56]
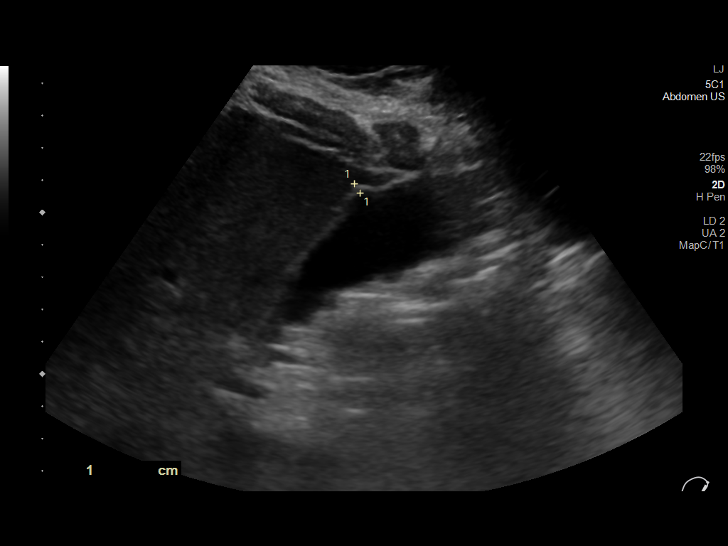
[im 5/56]
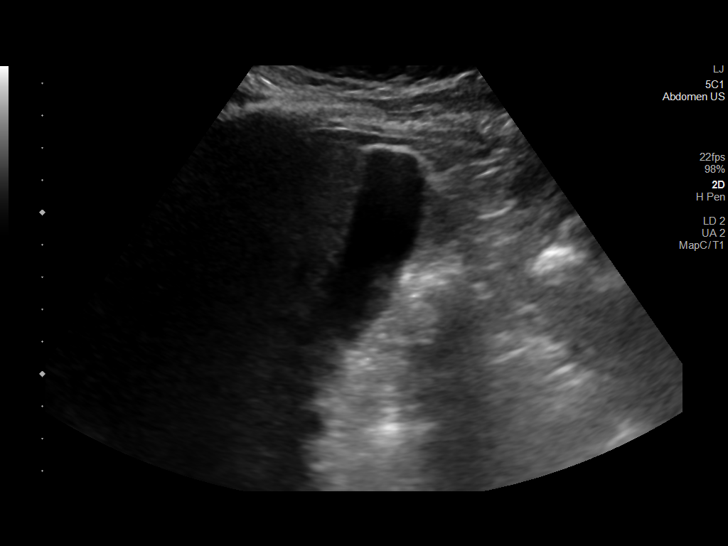
[im 10/56]
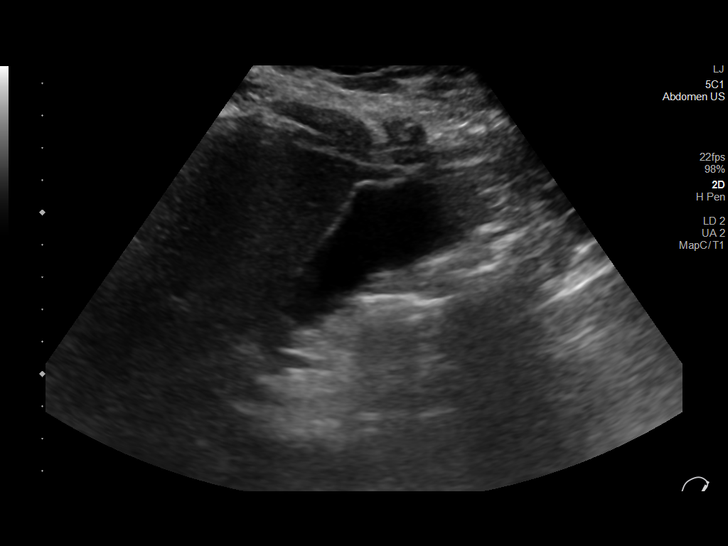
[im 14/56]
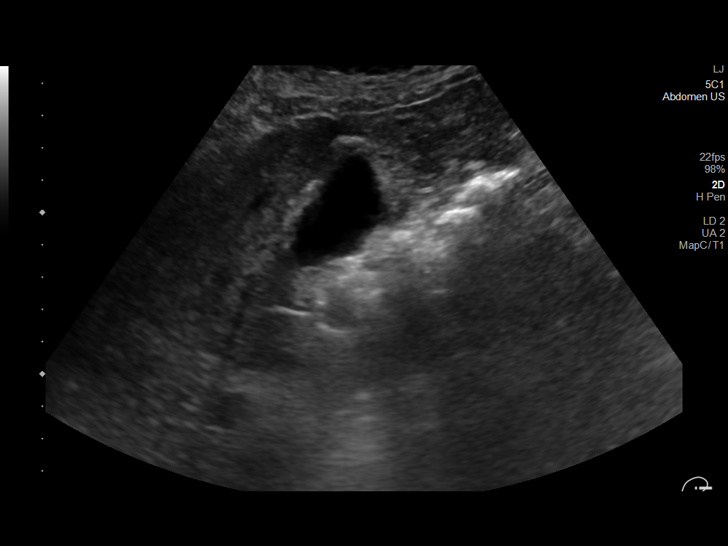
[im 19/56]
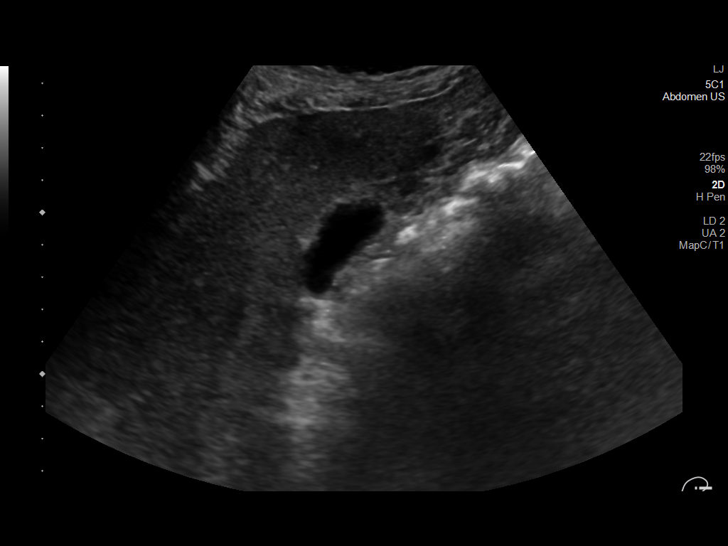
[im 21/56]
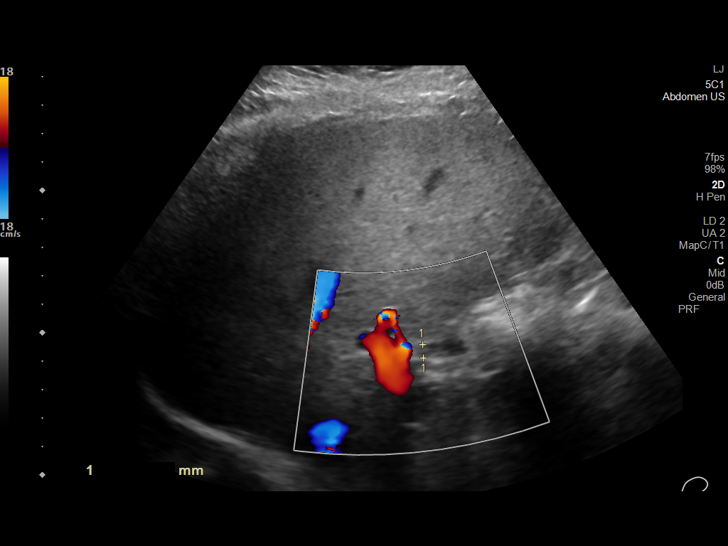
[im 26/56]
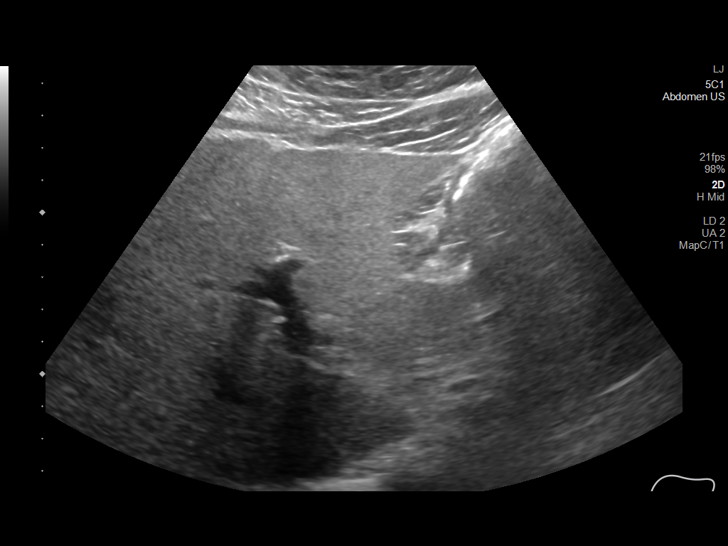
[im 30/56]
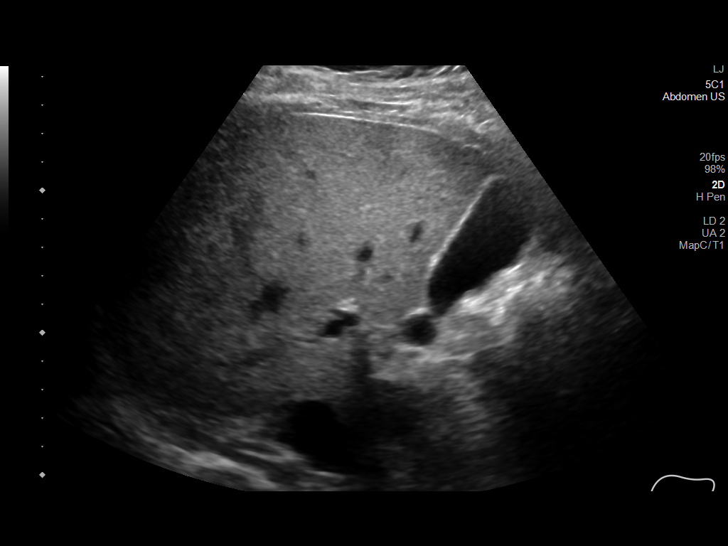
[im 35/56]
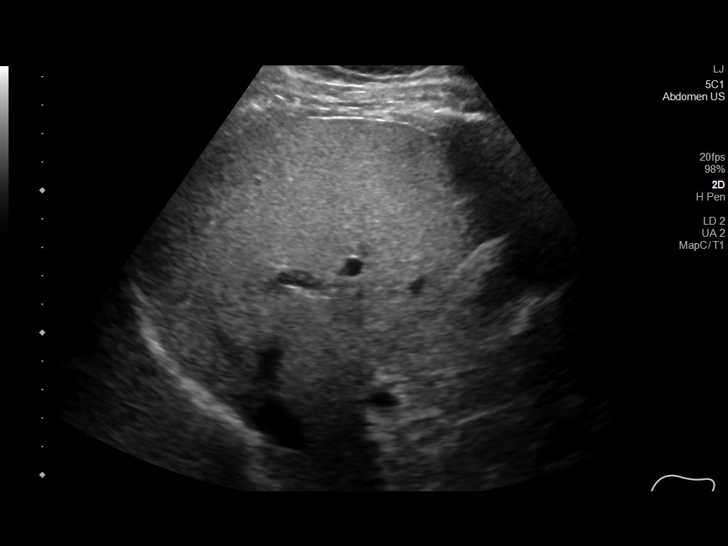
[im 37/56]
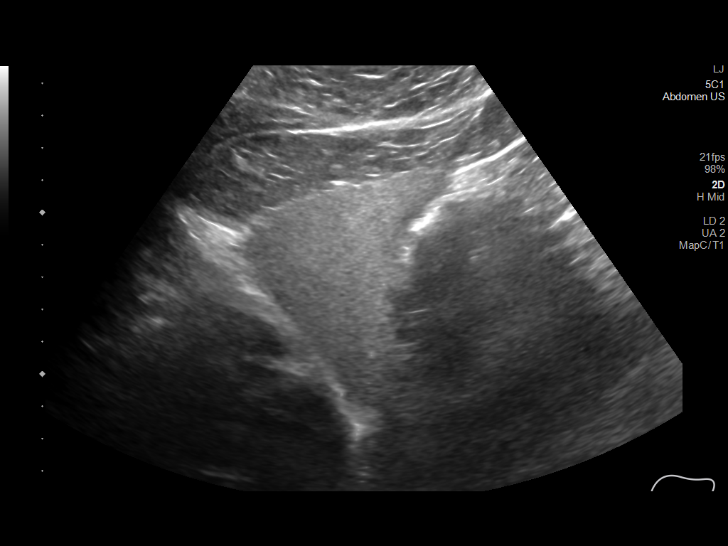
[im 42/56]
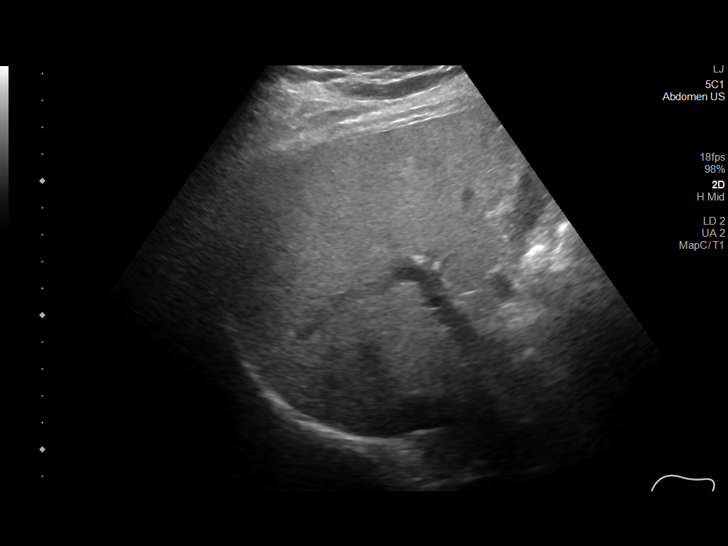
[im 46/56]
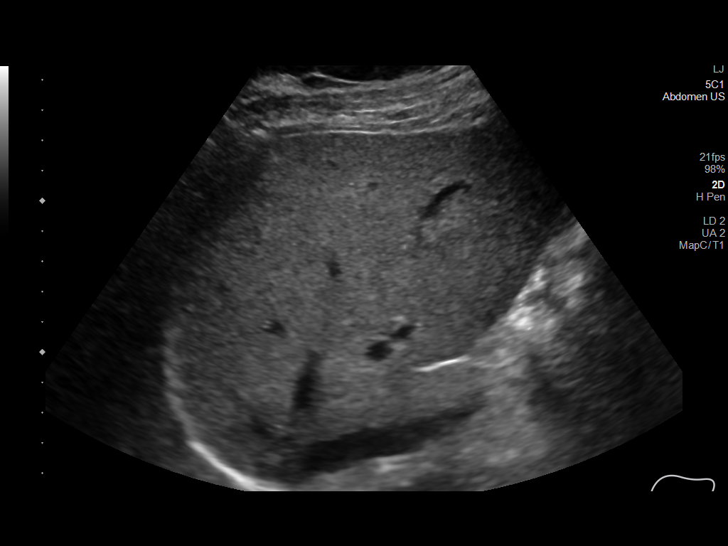
[im 51/56]
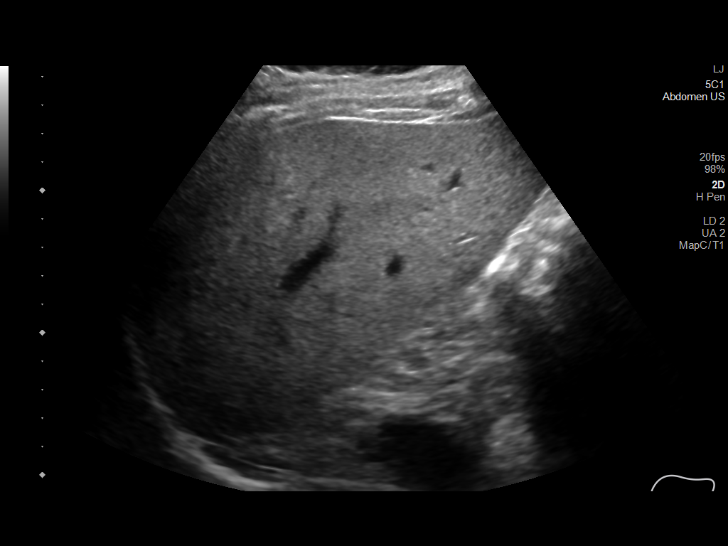
[im 56/56]
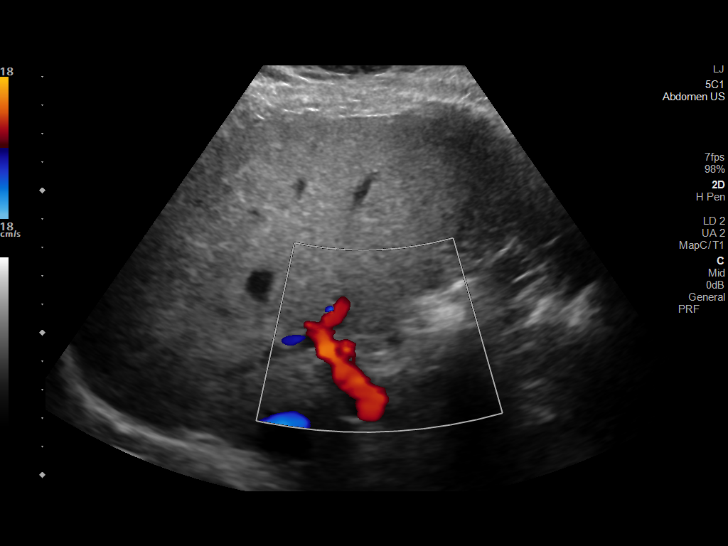

[14 of 25 positions shown; findings below may reference images not displayed]

FINDINGS: Gallbladder:

No gallstones or wall thickening visualized. No sonographic Murphy
sign noted by sonographer.

Common bile duct:

Diameter: 5 mm

Liver:

No focal lesion.

Diffusely increased parenchymal echogenicity.

Portal vein is patent on color Doppler imaging with normal direction
of blood flow towards the liver.

Other: None.
IMPRESSION: Diffuse increased echogenicity of the hepatic parenchyma is a
nonspecific indicator of hepatocellular dysfunction, most commonly
steatosis.

## 2022-11-06 IMAGING — CT CT ABD-PELV W/ CM
2 of 5 series · 16 of 46 positions shown, 18 images · IV contrast (APPLIED)
Comparison: Same day abdominal ultrasound

CLINICAL DATA: Three days of left lower quadrant abdominal pain.

EXAM:
CT ABDOMEN AND PELVIS WITH CONTRAST
TECHNIQUE: Multidetector CT imaging of the abdomen and pelvis was performed
using the standard protocol following bolus administration of
intravenous contrast.
CONTRAST:  100mL OMNIPAQUE IOHEXOL 300 MG/ML  SOLN

[Series 3: abdomen 5.0 · axial · 0.82mm/px · z∈[+980,+1415]mm · 13 of 101 slices shown, 15 images]
[im 7/101  soft-tissue]
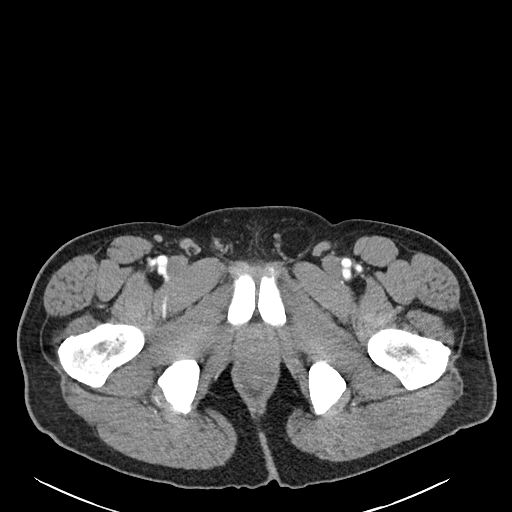
[im 7/101  bone]
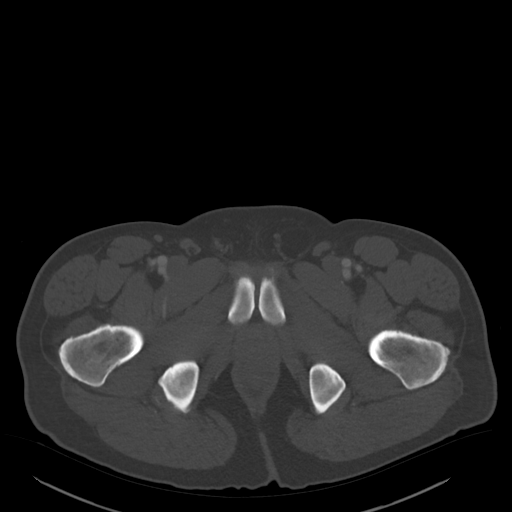
[im 14/101  soft-tissue]
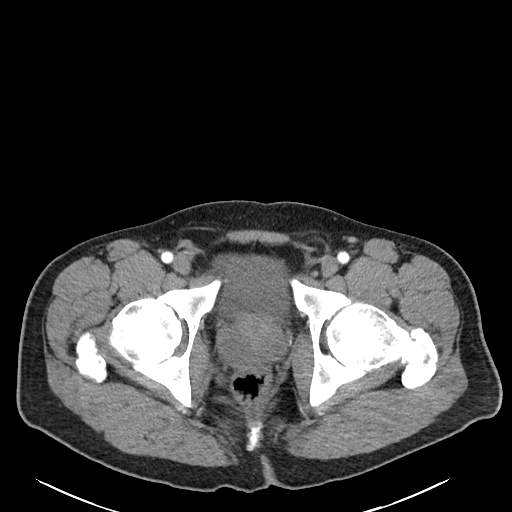
[im 21/101  soft-tissue]
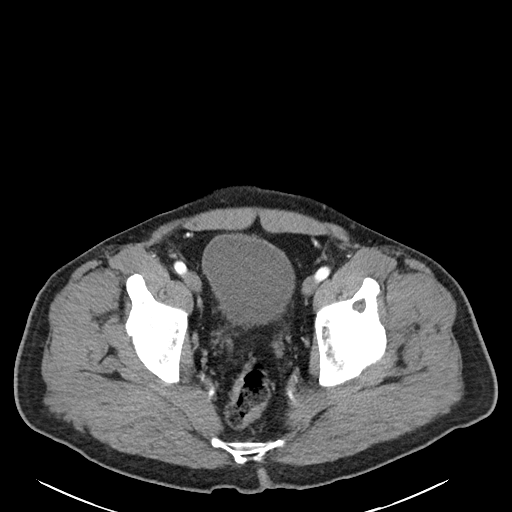
[im 27/101  soft-tissue]
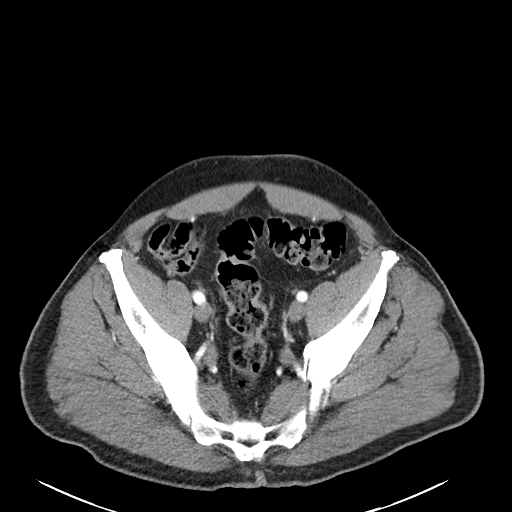
[im 34/101  soft-tissue]
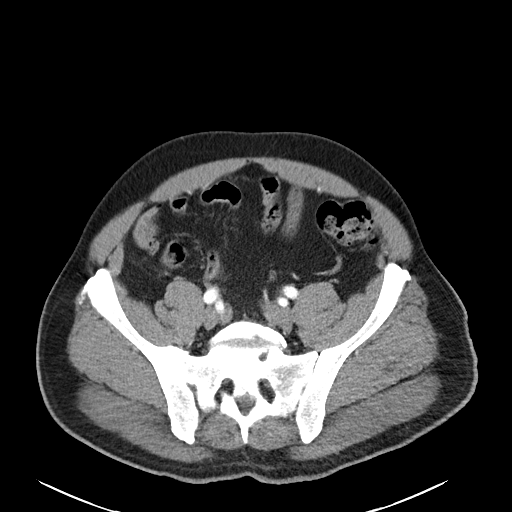
[im 41/101  soft-tissue]
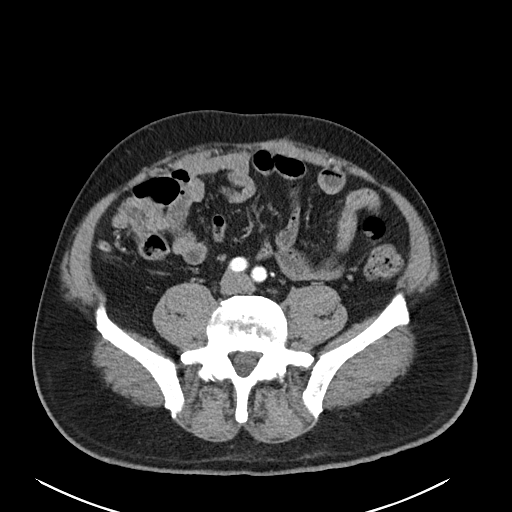
[im 54/101  soft-tissue]
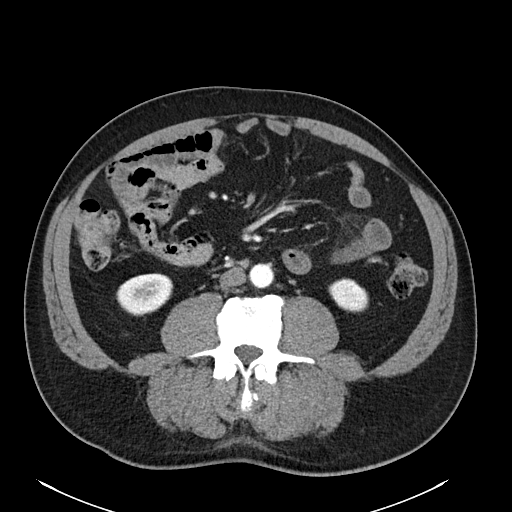
[im 61/101  soft-tissue]
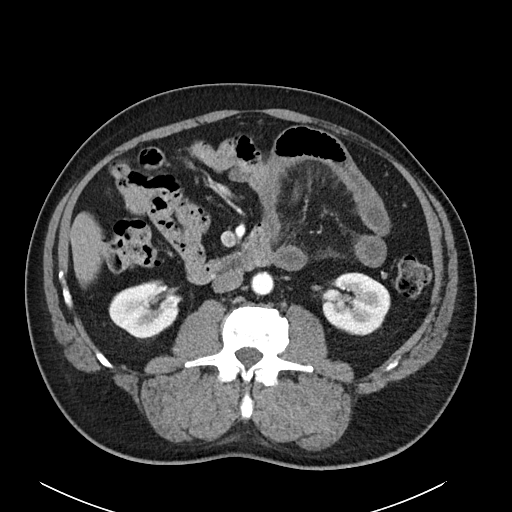
[im 67/101  soft-tissue]
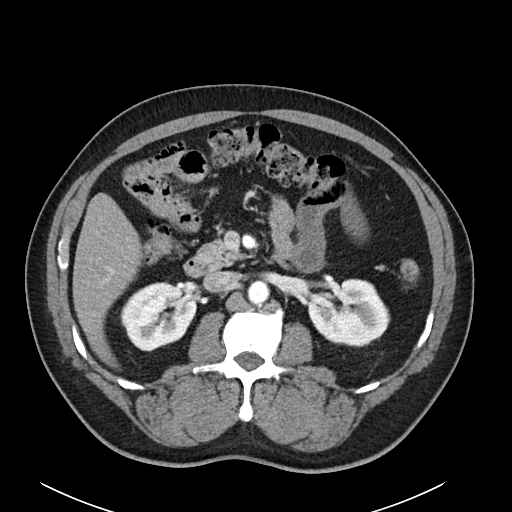
[im 67/101  bone]
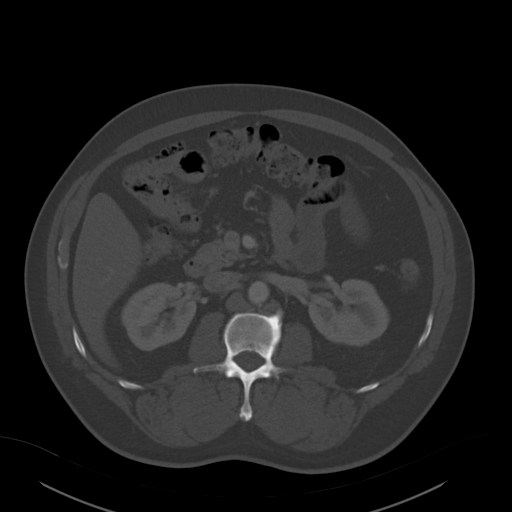
[im 74/101  soft-tissue]
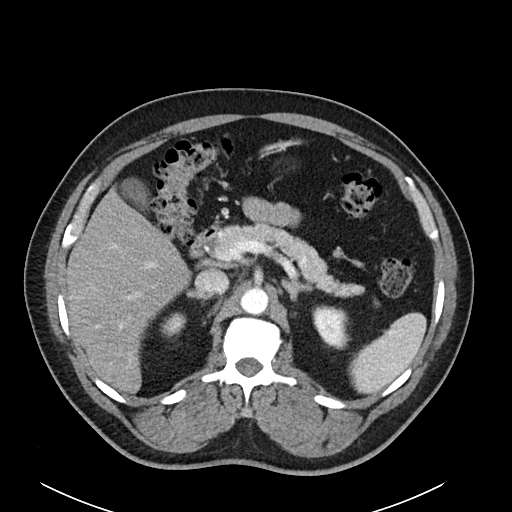
[im 81/101  soft-tissue]
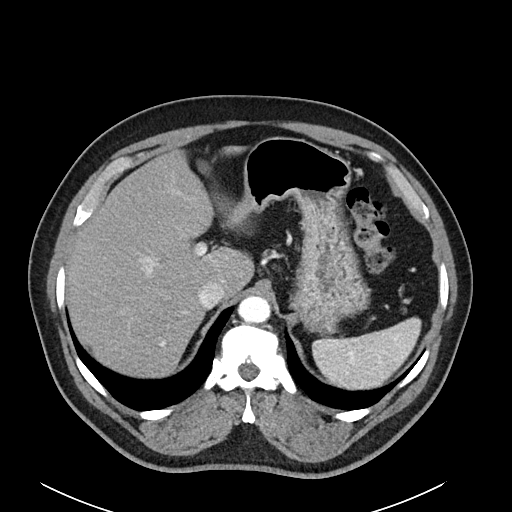
[im 87/101  soft-tissue]
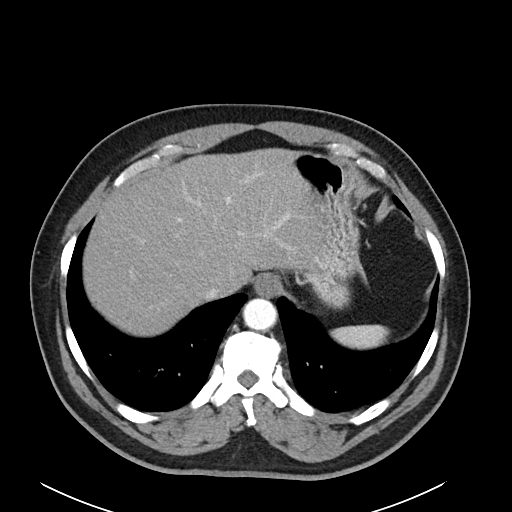
[im 94/101  soft-tissue]
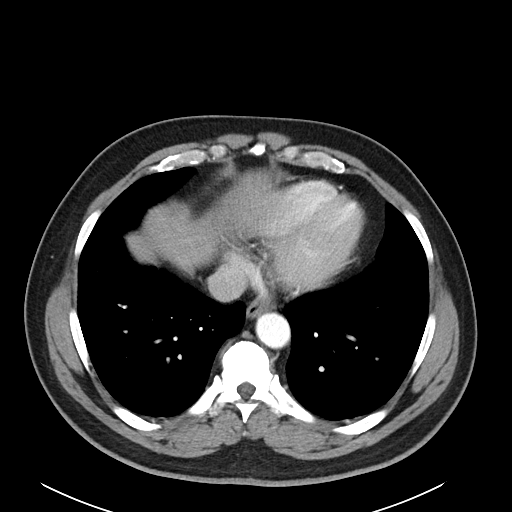

[Series 6: abdomen 3.0 mpr cor · coronal · 0.78mm/px · 3 of 114 slices shown]
[im 38/114  soft-tissue]
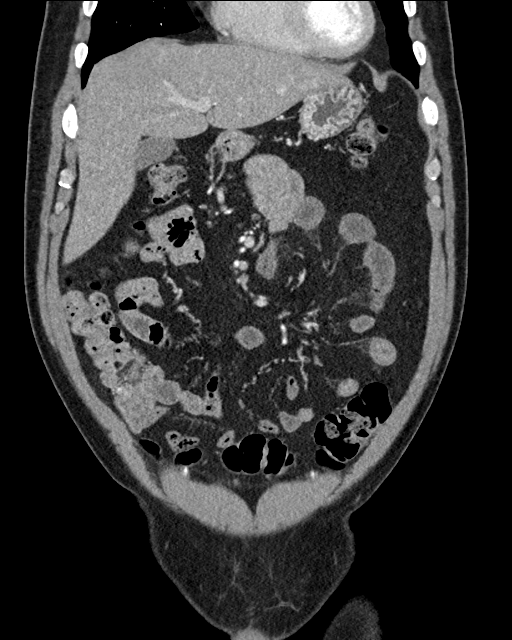
[im 51/114  soft-tissue]
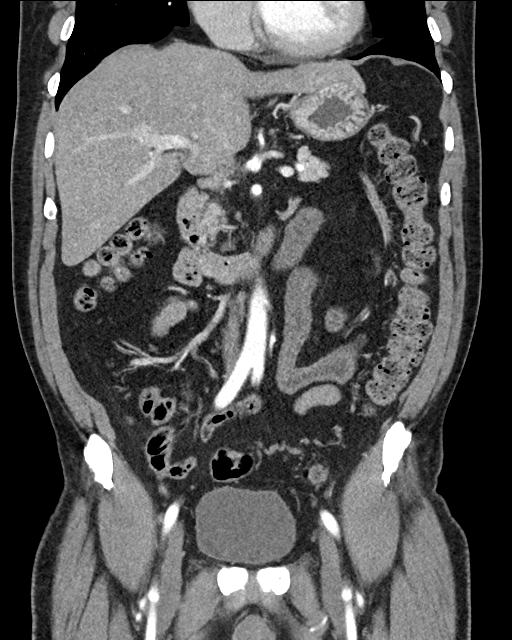
[im 63/114  soft-tissue]
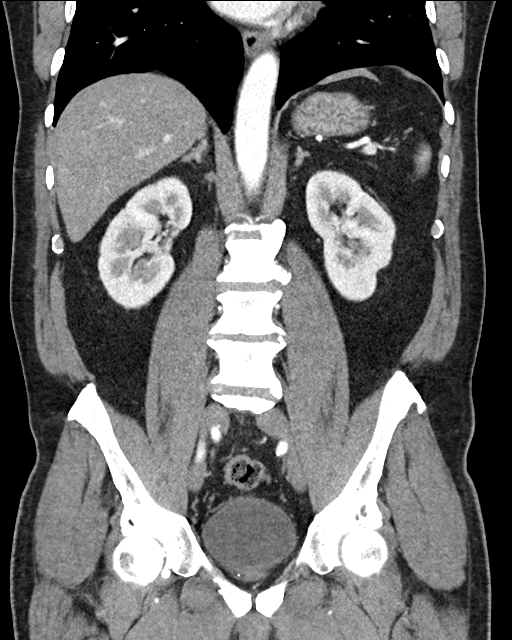

[16 of 46 positions shown; findings below may reference images not displayed]

FINDINGS: Lower chest: No acute abnormality. Normal size heart. No significant
pericardial effusion/thickening.

Hepatobiliary: Diffuse hepatic steatosis. No suspicious hepatic
lesion. Gallbladder is unremarkable. No biliary ductal dilation.

Pancreas: Unremarkable. No pancreatic ductal dilatation or
surrounding inflammatory changes.

Spleen: Normal in size without focal abnormality.

Adrenals/Urinary Tract: Adrenal glands are unremarkable. Kidneys are
normal, without renal calculi, focal lesion, or hydronephrosis.
Bladder is unremarkable.

Stomach/Bowel: Stomach is grossly unremarkable. Normal positioning
of the duodenum/ligament of Treitz. In left hemiabdomen there is a
segment of small bowel which is fluid-filled with wall thickening
and adjacent mesenteric stranding for instance on image 37/3 and
47/6. The appendix and terminal ileum are unremarkable. Scattered
colonic diverticulosis without findings of acute diverticulitis.

Vascular/Lymphatic: Aortic atherosclerosis. No enlarged abdominal or
pelvic lymph nodes.

Reproductive: Prostate is unremarkable.

Other: Small fat containing left inguinal hernia.

Musculoskeletal: Multilevel degenerative changes spine. Partial bony
ankylosis of the bilateral SI joints. Degenerative changes bilateral
hips. Bone island in the L3 vertebral body.
IMPRESSION: 1. Segment of fluid-filled small bowel with wall thickening and
adjacent mesenteric stranding in the left hemiabdomen, suggestive of
enteritis, which may be infectious or inflammatory in etiology.
2. Diffuse hepatic steatosis.
3. Scattered colonic diverticulosis without findings of acute
diverticulitis.
4. Small fat containing left inguinal hernia.
5. Aortic atherosclerosis.

Aortic Atherosclerosis ([id number]).

## 2023-06-01 ENCOUNTER — Emergency Department (HOSPITAL_BASED_OUTPATIENT_CLINIC_OR_DEPARTMENT_OTHER): Payer: 59 | Admitting: Radiology

## 2023-06-01 ENCOUNTER — Other Ambulatory Visit: Payer: Self-pay

## 2023-06-01 ENCOUNTER — Emergency Department (HOSPITAL_BASED_OUTPATIENT_CLINIC_OR_DEPARTMENT_OTHER)
Admission: EM | Admit: 2023-06-01 | Discharge: 2023-06-01 | Disposition: A | Discharge status: Home / Self Care | Payer: 59 | Attending: Emergency Medicine | Admitting: Emergency Medicine

## 2023-06-01 ENCOUNTER — Encounter (HOSPITAL_BASED_OUTPATIENT_CLINIC_OR_DEPARTMENT_OTHER): Payer: Self-pay | Admitting: *Deleted

## 2023-06-01 DIAGNOSIS — R739 Hyperglycemia, unspecified: Secondary | ICD-10-CM

## 2023-06-01 DIAGNOSIS — Z7984 Long term (current) use of oral hypoglycemic drugs: Secondary | ICD-10-CM | POA: Diagnosis not present

## 2023-06-01 DIAGNOSIS — I10 Essential (primary) hypertension: Secondary | ICD-10-CM | POA: Insufficient documentation

## 2023-06-01 DIAGNOSIS — R072 Precordial pain: Secondary | ICD-10-CM | POA: Insufficient documentation

## 2023-06-01 DIAGNOSIS — R066 Hiccough: Secondary | ICD-10-CM | POA: Diagnosis not present

## 2023-06-01 DIAGNOSIS — E1165 Type 2 diabetes mellitus with hyperglycemia: Secondary | ICD-10-CM | POA: Insufficient documentation

## 2023-06-01 DIAGNOSIS — R079 Chest pain, unspecified: Secondary | ICD-10-CM | POA: Diagnosis present

## 2023-06-01 DIAGNOSIS — E119 Type 2 diabetes mellitus without complications: Secondary | ICD-10-CM

## 2023-06-01 DIAGNOSIS — Z79899 Other long term (current) drug therapy: Secondary | ICD-10-CM | POA: Diagnosis not present

## 2023-06-01 LAB — COMPREHENSIVE METABOLIC PANEL
ALT: 30 U/L (ref 0–44)
AST: 23 U/L (ref 15–41)
Albumin: 4.4 g/dL (ref 3.5–5.0)
Alkaline Phosphatase: 68 U/L (ref 38–126)
Anion gap: 10 (ref 5–15)
BUN: 21 mg/dL — ABNORMAL HIGH (ref 6–20)
CO2: 27 mmol/L (ref 22–32)
Calcium: 9 mg/dL (ref 8.9–10.3)
Chloride: 98 mmol/L (ref 98–111)
Creatinine, Ser: 1.49 mg/dL — ABNORMAL HIGH (ref 0.61–1.24)
GFR, Estimated: 54 mL/min — ABNORMAL LOW (ref >60)
Glucose, Bld: 313 mg/dL — ABNORMAL HIGH (ref 70–99)
Potassium: 4 mmol/L (ref 3.5–5.1)
Sodium: 135 mmol/L (ref 135–145)
Total Bilirubin: 0.4 mg/dL (ref 0.3–1.2)
Total Protein: 7.6 g/dL (ref 6.5–8.1)

## 2023-06-01 LAB — CBC WITH DIFFERENTIAL/PLATELET
Abs Immature Granulocytes: 0.01 10*3/uL (ref 0.00–0.07)
Basophils Absolute: 0 10*3/uL (ref 0.0–0.1)
Basophils Relative: 0 %
Eosinophils Absolute: 0.1 10*3/uL (ref 0.0–0.5)
Eosinophils Relative: 1 %
HCT: 35.5 % — ABNORMAL LOW (ref 39.0–52.0)
Hemoglobin: 12.2 g/dL — ABNORMAL LOW (ref 13.0–17.0)
Immature Granulocytes: 0 %
Lymphocytes Relative: 21 %
Lymphs Abs: 1.1 10*3/uL (ref 0.7–4.0)
MCH: 28.8 pg (ref 26.0–34.0)
MCHC: 34.4 g/dL (ref 30.0–36.0)
MCV: 83.7 fL (ref 80.0–100.0)
Monocytes Absolute: 0.5 10*3/uL (ref 0.1–1.0)
Monocytes Relative: 9 %
Neutro Abs: 3.5 10*3/uL (ref 1.7–7.7)
Neutrophils Relative %: 69 %
Platelets: 212 10*3/uL (ref 150–400)
RBC: 4.24 MIL/uL (ref 4.22–5.81)
RDW: 12.7 % (ref 11.5–15.5)
WBC: 5.1 10*3/uL (ref 4.0–10.5)
nRBC: 0 % (ref 0.0–0.2)

## 2023-06-01 LAB — CBG MONITORING, ED: Glucose-Capillary: 251 mg/dL — ABNORMAL HIGH (ref 70–99)

## 2023-06-01 LAB — TROPONIN I (HIGH SENSITIVITY): Troponin I (High Sensitivity): 10 ng/L (ref <18)

## 2023-06-01 MED ORDER — METFORMIN HCL ER 500 MG PO TB24: 500.0000 mg | ORAL_TABLET | Freq: Two times a day (BID) | ORAL | 0 refills | Status: AC

## 2023-06-01 MED ORDER — INSULIN ASPART 100 UNIT/ML IJ SOLN
6.0000 [IU] | Freq: Once | INTRAMUSCULAR | Status: AC
  Administered 2023-06-01: 6 [IU] via SUBCUTANEOUS

## 2023-06-01 MED ORDER — ACETAMINOPHEN 500 MG PO TABS
1000.0000 mg | ORAL_TABLET | Freq: Once | ORAL | Status: AC
  Administered 2023-06-01: 1000 mg via ORAL
  Filled 2023-06-01: qty 2

## 2023-06-01 MED ORDER — SODIUM CHLORIDE 0.9 % IV BOLUS
1000.0000 mL | Freq: Once | INTRAVENOUS | Status: AC
  Administered 2023-06-01: 1000 mL via INTRAVENOUS

## 2023-06-01 MED ORDER — AMLODIPINE BESYLATE 5 MG PO TABS: 5.0000 mg | ORAL_TABLET | Freq: Every day | ORAL | 0 refills | Status: AC

## 2023-06-01 NOTE — ED Notes (Signed)
To x-ray

## 2023-06-01 NOTE — Discharge Instructions (Addendum)
It was our pleasure to provide your ER care today - we hope that you feel better.  For diabetes/high blood sugars, follow diabetes meal plan, drink plenty of water/stay well hydrated, take your medication as prescribed, check blood sugars 4x/day and record values (before meals and at nighttime), and follow up with primary care doctor in the coming week.  For high blood pressure, take medication as prescribed, and follow up with primary care doctor in the coming week.  Given history of uncontrolled diabetes and hypertension, and recent atypical chest pain, follow up with cardiologist in the next couple weeks.   Return to ER if worse, new symptoms, increased trouble breathing, recurrent/persistent chest pain, or other concern.

## 2023-06-01 NOTE — ED Triage Notes (Addendum)
C/o hiccups on and off since yesterday. States last night at 2300 pt began having chest pain describes as sharp and comes and goes. Denies any sob or vomiting. C/o of nausea. C/o  headache. Has been out of his b/p meds/blood sugar meds for a month. Needs a prescription for these.

## 2023-06-01 NOTE — ED Notes (Signed)
Patient verbalizes understanding of discharge instructions. Opportunity for questioning and answers were provided. Patient discharged from ED.  °

## 2023-06-01 NOTE — ED Provider Notes (Addendum)
[facility] Provider Note   CSN: [id number] Arrival date & time: 06/01/23  0555     History  Chief Complaint  Patient presents with   Chest Pain    Antonio Johnson is a 62 y.o. male.  Pt c/o hiccoughs intermittently in past two days. After hiccoughing, noted sharp pain to mid chest. Sharp, lasts seconds per episode, at rest.  Symptoms began early yesterday AM. No associated sob, nv or diaphoresis. No exertional chest pain or discomfort. No unusual doe. No pleuritic chest pain. No leg pain or swelling. No hx cad or fam hx premature cad. No heartburn. States also concerned that blood sugar and blood pressure may be high as has been without meds for months. No polyuria. No wt loss. No fever or chills. No headache. No neuro c/o. States also needs work note.   The history is provided by the patient and medical records.  Chest Pain Associated symptoms: no abdominal pain, no back pain, no cough, no fever, no headache, no nausea, no numbness, no palpitations, no shortness of breath, no vomiting and no weakness        Home Medications Prior to Admission medications   Medication Sig Start Date End Date Taking? Authorizing Provider  amLODipine (NORVASC) 5 MG tablet Take 1 tablet (5 mg total) by mouth daily. 07/03/21   Rhys Martini, PA-C  metFORMIN (GLUCOPHAGE-XR) 500 MG 24 hr tablet Take 1 tablet (500 mg total) by mouth every evening. 07/03/21   Rhys Martini, PA-C      Allergies    Patient has no known allergies.    Review of Systems   Review of Systems  Constitutional:  Negative for chills and fever.  HENT:  Negative for sore throat.   Eyes:  Negative for redness.  Respiratory:  Negative for cough and shortness of breath.   Cardiovascular:  Positive for chest pain. Negative for palpitations and leg swelling.  Gastrointestinal:  Negative for abdominal pain, nausea and vomiting.  Endocrine: Negative for polyuria.  Genitourinary:  Negative  for flank pain.  Musculoskeletal:  Negative for back pain and neck pain.  Skin:  Negative for rash.  Neurological:  Negative for speech difficulty, weakness, numbness and headaches.  Hematological:  Does not bruise/bleed easily.  Psychiatric/Behavioral:  Negative for confusion.     Physical Exam Updated Vital Signs BP (!) 185/97 (BP Location: Right Arm)   Pulse 80   Temp 98.2 F (36.8 C)   Resp 19   Ht 1.956 m (6\' 5" )   Wt 113.4 kg   SpO2 95%   BMI 29.65 kg/m  Physical Exam Vitals and nursing note reviewed.  Constitutional:      Appearance: Normal appearance. He is well-developed.  HENT:     Head: Atraumatic.     Nose: Nose normal.     Mouth/Throat:     Mouth: Mucous membranes are moist.  Eyes:     General: No scleral icterus.    Conjunctiva/sclera: Conjunctivae normal.  Neck:     Vascular: No carotid bruit.     Trachea: No tracheal deviation.  Cardiovascular:     Rate and Rhythm: Normal rate and regular rhythm.     Pulses: Normal pulses.     Heart sounds: Normal heart sounds. No murmur heard.    No friction rub. No gallop.  Pulmonary:     Effort: Pulmonary effort is normal. No accessory muscle usage or respiratory distress.     Breath sounds: Normal  breath sounds.  Chest:     Chest wall: No tenderness.  Abdominal:     General: There is no distension.     Palpations: Abdomen is soft.     Tenderness: There is no abdominal tenderness. There is no guarding.  Musculoskeletal:        General: No swelling or tenderness.     Cervical back: Normal range of motion and neck supple. No rigidity.     Right lower leg: No edema.     Left lower leg: No edema.  Skin:    General: Skin is warm and dry.     Findings: No rash.  Neurological:     Mental Status: He is alert.     Comments: Alert, speech clear. Motor/sens grossly intact. Steady gait.   Psychiatric:        Mood and Affect: Mood normal.     ED Results / Procedures / Treatments   Labs (all labs ordered are  listed, but only abnormal results are displayed) Results for orders placed or performed during the hospital encounter of 06/01/23  CBC with Differential  Result Value Ref Range   WBC 5.1 4.0 - 10.5 K/uL   RBC 4.24 4.22 - 5.81 MIL/uL   Hemoglobin 12.2 (L) 13.0 - 17.0 g/dL   HCT 81.1 (L) 91.4 - 78.2 %   MCV 83.7 80.0 - 100.0 fL   MCH 28.8 26.0 - 34.0 pg   MCHC 34.4 30.0 - 36.0 g/dL   RDW 95.6 21.3 - 08.6 %   Platelets 212 150 - 400 K/uL   nRBC 0.0 0.0 - 0.2 %   Neutrophils Relative % 69 %   Neutro Abs 3.5 1.7 - 7.7 K/uL   Lymphocytes Relative 21 %   Lymphs Abs 1.1 0.7 - 4.0 K/uL   Monocytes Relative 9 %   Monocytes Absolute 0.5 0.1 - 1.0 K/uL   Eosinophils Relative 1 %   Eosinophils Absolute 0.1 0.0 - 0.5 K/uL   Basophils Relative 0 %   Basophils Absolute 0.0 0.0 - 0.1 K/uL   Immature Granulocytes 0 %   Abs Immature Granulocytes 0.01 0.00 - 0.07 K/uL  Comprehensive metabolic panel  Result Value Ref Range   Sodium 135 135 - 145 mmol/L   Potassium 4.0 3.5 - 5.1 mmol/L   Chloride 98 98 - 111 mmol/L   CO2 27 22 - 32 mmol/L   Glucose, Bld 313 (H) 70 - 99 mg/dL   BUN 21 (H) 6 - 20 mg/dL   Creatinine, Ser 5.78 (H) 0.61 - 1.24 mg/dL   Calcium 9.0 8.9 - 46.9 mg/dL   Total Protein 7.6 6.5 - 8.1 g/dL   Albumin 4.4 3.5 - 5.0 g/dL   AST 23 15 - 41 U/L   ALT 30 0 - 44 U/L   Alkaline Phosphatase 68 38 - 126 U/L   Total Bilirubin 0.4 0.3 - 1.2 mg/dL   GFR, Estimated 54 (L) >60 mL/min   Anion gap 10 5 - 15  Troponin I (High Sensitivity)  Result Value Ref Range   Troponin I (High Sensitivity) 10 <18 ng/L   DG Chest 2 View  Result Date: 06/01/2023 CLINICAL DATA:  62 year old male with history of chest pain. EXAM: CHEST - 2 VIEW COMPARISON:  Chest x-ray 09/02/2016. FINDINGS: Lung volumes are normal. No consolidative airspace disease. No pleural effusions. No pneumothorax. No pulmonary nodule or mass noted. Pulmonary vasculature and the cardiomediastinal silhouette are within normal  limits. IMPRESSION: No radiographic evidence of acute  cardiopulmonary disease. Electronically Signed   By: Trudie Reed M.D.   On: 06/01/2023 06:42    EKG EKG Interpretation Date/Time:  Monday June 01 2023 06:07:02 EDT Ventricular Rate:  83 PR Interval:  195 QRS Duration:  79 QT Interval:  395 QTC Calculation: 465 R Axis:   51  Text Interpretation: Sinus rhythm Anterior infarct, old No significant change since last tracing Confirmed by Susy Frizzle [phone]) on 06/01/2023 6:42:21 AM  Radiology DG Chest 2 View  Result Date: 06/01/2023 CLINICAL DATA:  62 year old male with history of chest pain. EXAM: CHEST - 2 VIEW COMPARISON:  Chest x-ray 09/02/2016. FINDINGS: Lung volumes are normal. No consolidative airspace disease. No pleural effusions. No pneumothorax. No pulmonary nodule or mass noted. Pulmonary vasculature and the cardiomediastinal silhouette are within normal limits. IMPRESSION: No radiographic evidence of acute cardiopulmonary disease. Electronically Signed   By: Trudie Reed M.D.   On: 06/01/2023 06:42    Procedures Procedures    Medications Ordered in ED Medications  acetaminophen (TYLENOL) tablet 1,000 mg (has no administration in time range)  sodium chloride 0.9 % bolus 1,000 mL (has no administration in time range)  insulin aspart (novoLOG) injection 6 Units (has no administration in time range)    ED Course/ Medical Decision Making/ A&P                             Medical Decision Making Problems Addressed: Diabetes mellitus type 2, noninsulin dependent (HCC): chronic illness or injury with exacerbation, progression, or side effects of treatment that poses a threat to life or bodily functions Essential hypertension: chronic illness or injury that poses a threat to life or bodily functions Hiccoughs: acute illness or injury Hyperglycemia: acute illness or injury Precordial chest pain: acute illness or injury with systemic symptoms that poses a threat to  life or bodily functions Uncontrolled hypertension: chronic illness or injury with exacerbation, progression, or side effects of treatment that poses a threat to life or bodily functions  Amount and/or Complexity of Data Reviewed External Data Reviewed: notes. Labs: ordered. Decision-making details documented in ED Course. Radiology: ordered and independent interpretation performed. Decision-making details documented in ED Course. ECG/medicine tests: ordered and independent interpretation performed. Decision-making details documented in ED Course.  Risk OTC drugs. Prescription drug management. Decision regarding hospitalization.   Iv ns. Continuous pulse ox and cardiac monitoring. Labs ordered/sent. Imaging ordered.   Differential diagnosis includes acs, msk cp, gi cp, etc. Dispo decision including potential need for admission considered - will get labs and imaging and reassess.   Reviewed nursing notes and prior charts for additional history. External reports reviewed.   Cardiac monitor: sinus rhythm, rate 80.  Labs reviewed/interpreted by me - trop normal - after symptoms for past 24 hr+, felt not c/w acs. Na/k normal. Glucose elevated. Hco3 normal. Ns bolus. Novolog sq. Bp elevated but does not required emergent lowering. Will give rx for his home meds. Pt indicates has pcp with whom he can/will follow up 'on [place]'.  Requests work note - provided.   Xrays reviewed/interpreted by me - no pna. Glucose improved from prior.   Pt currently appears stable for d/c.   Rec pcp follow up. Although cp very atypical, given hx uncontrolled dm and htn, will refer to f/u.   Return precautions provided.            Final Clinical Impression(s) / ED Diagnoses Final diagnoses:  None    Rx /  DC Orders ED Discharge Orders     None         Cathren Laine, MD 06/01/23 [phone]

## 2023-09-03 ENCOUNTER — Ambulatory Visit (HOSPITAL_BASED_OUTPATIENT_CLINIC_OR_DEPARTMENT_OTHER): Payer: 59 | Admitting: Cardiology

## 2023-09-09 ENCOUNTER — Encounter (HOSPITAL_BASED_OUTPATIENT_CLINIC_OR_DEPARTMENT_OTHER): Payer: Self-pay

## 2023-09-29 ENCOUNTER — Encounter (HOSPITAL_COMMUNITY): Payer: Self-pay | Admitting: *Deleted

## 2023-09-29 ENCOUNTER — Ambulatory Visit (HOSPITAL_COMMUNITY)
Admission: EM | Admit: 2023-09-29 | Discharge: 2023-09-29 | Disposition: A | Discharge status: Home / Self Care | Payer: 59 | Attending: Emergency Medicine | Admitting: Emergency Medicine

## 2023-09-29 ENCOUNTER — Other Ambulatory Visit: Payer: Self-pay

## 2023-09-29 DIAGNOSIS — J189 Pneumonia, unspecified organism: Secondary | ICD-10-CM

## 2023-09-29 DIAGNOSIS — J188 Other pneumonia, unspecified organism: Secondary | ICD-10-CM

## 2023-09-29 MED ORDER — AZITHROMYCIN 250 MG PO TABS: 250.0000 mg | ORAL_TABLET | Freq: Every day | ORAL | 0 refills | Status: AC

## 2023-09-29 MED ORDER — AEROCHAMBER PLUS FLO-VU LARGE MISC
Status: AC
  Filled 2023-09-29: qty 1

## 2023-09-29 MED ORDER — BENZONATATE 100 MG PO CAPS: 100.0000 mg | ORAL_CAPSULE | Freq: Three times a day (TID) | ORAL | 0 refills | Status: AC

## 2023-09-29 MED ORDER — ALBUTEROL SULFATE HFA 108 (90 BASE) MCG/ACT IN AERS
2.0000 | INHALATION_SPRAY | Freq: Once | RESPIRATORY_TRACT | Status: AC
  Administered 2023-09-29: 2 via RESPIRATORY_TRACT

## 2023-09-29 MED ORDER — ALBUTEROL SULFATE HFA 108 (90 BASE) MCG/ACT IN AERS
INHALATION_SPRAY | RESPIRATORY_TRACT | Status: AC
  Filled 2023-09-29: qty 6.7

## 2023-09-29 MED ORDER — AEROCHAMBER PLUS FLO-VU MEDIUM MISC
1.0000 | Freq: Once | Status: AC
  Administered 2023-09-29: 1

## 2023-09-29 NOTE — Discharge Instructions (Signed)
Use the albuterol inhaler with the spacer 2 puffs every 4 to 6 hours if you are having shortness of breath or wheezing. When you are feeling better, you don't need to use it anymore.   Finish all of the antibiotics. If you are not improving with this treatment plan, please get rechecked (either here or by your primary care provider). If your breathing symptoms become severe and is an emergency and it is not helped by the inhaler, please go to the ER.

## 2023-09-29 NOTE — ED Triage Notes (Signed)
Pt reports he has had a cough for 1.5 weeks and a sore throat. Pt has tried multiple OTC with out relief.

## 2023-09-29 NOTE — ED Provider Notes (Signed)
[facility]    CSN: [id number] Arrival date & time: 09/29/23  0805      History   Chief Complaint Chief Complaint  Patient presents with   Cough   Sore Throat   Nasal Congestion    HPI Antonio Johnson is a 62 y.o. male. Has been sick for 1.5 weeks, worsening cough. Also has congestion, post nasal drainage, sore throat. Is taking OTC cold medicine for no relief of sx. Also has been feeling a little SOB/wheezing. No hx of asthma. Wife had similar infection and got better on her own. Pt is not getting better. Denies fever or chills.    Cough Sore Throat    Past Medical History:  Diagnosis Date   Allergy    Chest pain    Clotting disorder (HCC)    Diabetes mellitus without complication (HCC)    Dvt femoral (deep venous thrombosis) (HCC)    Hypertension     There are no problems to display for this patient.   Past Surgical History:  Procedure Laterality Date   WISDOM TOOTH EXTRACTION         Home Medications    Prior to Admission medications   Medication Sig Start Date End Date Taking? Authorizing Provider  amLODipine (NORVASC) 5 MG tablet Take 1 tablet (5 mg total) by mouth daily. 06/01/23  Yes Cathren Laine, MD  azithromycin (ZITHROMAX) 250 MG tablet Take 1 tablet (250 mg total) by mouth daily. Take first 2 tablets together, then 1 every day until finished. 09/29/23  Yes Cathlyn Parsons, NP  benzonatate (TESSALON) 100 MG capsule Take 1 capsule (100 mg total) by mouth every 8 (eight) hours. 09/29/23  Yes Cathlyn Parsons, NP  metFORMIN (GLUCOPHAGE-XR) 500 MG 24 hr tablet Take 1 tablet (500 mg total) by mouth 2 (two) times daily with a meal. 06/01/23  Yes Cathren Laine, MD    Family History Family History  Problem Relation Age of Onset   Rectal cancer Maternal Uncle    Colon cancer Maternal Uncle    Colon cancer Cousin    Esophageal cancer Neg Hx    Stomach cancer Neg Hx     Social History Social History   Tobacco Use   Smoking status: Never    Smokeless tobacco: Never  Vaping Use   Vaping status: Never Used  Substance Use Topics   Alcohol use: No   Drug use: No     Allergies   Patient has no known allergies.   Review of Systems Review of Systems  Respiratory:  Positive for cough.      Physical Exam Triage Vital Signs ED Triage Vitals  Encounter Vitals Group     BP 09/29/23 0832 (!) 155/81     Systolic BP Percentile --      Diastolic BP Percentile --      Pulse Rate 09/29/23 0832 70     Resp 09/29/23 0832 18     Temp 09/29/23 0832 98 F (36.7 C)     Temp src --      SpO2 09/29/23 0832 96 %     Weight --      Height --      Head Circumference --      Peak Flow --      Pain Score 09/29/23 0830 7     Pain Loc --      Pain Education --      Exclude from Growth Chart --    No data found.  Updated Vital Signs BP (!) 155/81   Pulse 70   Temp 98 F (36.7 C)   Resp 18   SpO2 96%   Visual Acuity Right Eye Distance:   Left Eye Distance:   Bilateral Distance:    Right Eye Near:   Left Eye Near:    Bilateral Near:     Physical Exam Constitutional:      General: He is not in acute distress.    Appearance: He is well-developed. He is ill-appearing. He is not toxic-appearing.  HENT:     Right Ear: External ear normal. There is impacted cerumen.     Left Ear: External ear normal. There is impacted cerumen.     Nose: Congestion present.     Mouth/Throat:     Mouth: Mucous membranes are moist.     Pharynx: Oropharynx is clear. No oropharyngeal exudate or posterior oropharyngeal erythema.  Cardiovascular:     Rate and Rhythm: Normal rate and regular rhythm.  Pulmonary:     Effort: Pulmonary effort is normal.     Breath sounds: Rales present.     Comments: Frequent coughing spells Neurological:     Mental Status: He is alert.      UC Treatments / Results  Labs (all labs ordered are listed, but only abnormal results are displayed) Labs Reviewed - No data to display  EKG   Radiology No  results found.  Procedures Procedures (including critical care time)  Medications Ordered in UC Medications  albuterol (VENTOLIN HFA) 108 (90 Base) MCG/ACT inhaler 2 puff (2 puffs Inhalation Given 09/29/23 0844)  AeroChamber Plus Flo-Vu Medium MISC 1 each (1 each Other Given 09/29/23 0844)    Initial Impression / Assessment and Plan / UC Course  I have reviewed the triage vital signs and the nursing notes.  Pertinent labs & imaging results that were available during my care of the patient were reviewed by me and considered in my medical decision making (see chart for details).    Pt given albuterol inhaler and spacer while here in UC. Lung sounds improved after inhaler use and some coughing. I am concerned for CAP. Will tx empirically.   Final Clinical Impressions(s) / UC Diagnoses   Final diagnoses:  Community acquired pneumonia, unspecified laterality     Discharge Instructions      Use the albuterol inhaler with the spacer 2 puffs every 4 to 6 hours if you are having shortness of breath or wheezing. When you are feeling better, you don't need to use it anymore.   Finish all of the antibiotics. If you are not improving with this treatment plan, please get rechecked (either here or by your primary care provider). If your breathing symptoms become severe and is an emergency and it is not helped by the inhaler, please go to the ER.    ED Prescriptions     Medication Sig Dispense Auth. Provider   azithromycin (ZITHROMAX) 250 MG tablet Take 1 tablet (250 mg total) by mouth daily. Take first 2 tablets together, then 1 every day until finished. 6 tablet Cathlyn Parsons, NP   benzonatate (TESSALON) 100 MG capsule Take 1 capsule (100 mg total) by mouth every 8 (eight) hours. 21 capsule Cathlyn Parsons, NP      PDMP not reviewed this encounter.   Cathlyn Parsons, NP 09/29/23 [phone]

## 2024-04-18 ENCOUNTER — Emergency Department (HOSPITAL_BASED_OUTPATIENT_CLINIC_OR_DEPARTMENT_OTHER)

## 2024-04-18 ENCOUNTER — Other Ambulatory Visit: Payer: Self-pay

## 2024-04-18 ENCOUNTER — Encounter (HOSPITAL_BASED_OUTPATIENT_CLINIC_OR_DEPARTMENT_OTHER): Payer: Self-pay

## 2024-04-18 ENCOUNTER — Emergency Department (HOSPITAL_BASED_OUTPATIENT_CLINIC_OR_DEPARTMENT_OTHER)
Admission: EM | Admit: 2024-04-18 | Discharge: 2024-04-18 | Disposition: A | Discharge status: Home / Self Care | Attending: Emergency Medicine | Admitting: Emergency Medicine

## 2024-04-18 DIAGNOSIS — N189 Chronic kidney disease, unspecified: Secondary | ICD-10-CM | POA: Diagnosis not present

## 2024-04-18 DIAGNOSIS — Z79899 Other long term (current) drug therapy: Secondary | ICD-10-CM | POA: Insufficient documentation

## 2024-04-18 DIAGNOSIS — D649 Anemia, unspecified: Secondary | ICD-10-CM | POA: Insufficient documentation

## 2024-04-18 DIAGNOSIS — M7989 Other specified soft tissue disorders: Secondary | ICD-10-CM | POA: Insufficient documentation

## 2024-04-18 DIAGNOSIS — Z7984 Long term (current) use of oral hypoglycemic drugs: Secondary | ICD-10-CM | POA: Insufficient documentation

## 2024-04-18 DIAGNOSIS — E1165 Type 2 diabetes mellitus with hyperglycemia: Secondary | ICD-10-CM | POA: Diagnosis not present

## 2024-04-18 LAB — CBC WITH DIFFERENTIAL/PLATELET
Abs Immature Granulocytes: 0.02 10*3/uL (ref 0.00–0.07)
Basophils Absolute: 0 10*3/uL (ref 0.0–0.1)
Basophils Relative: 0 %
Eosinophils Absolute: 0 10*3/uL (ref 0.0–0.5)
Eosinophils Relative: 1 %
HCT: 33.7 % — ABNORMAL LOW (ref 39.0–52.0)
Hemoglobin: 11.7 g/dL — ABNORMAL LOW (ref 13.0–17.0)
Immature Granulocytes: 0 %
Lymphocytes Relative: 26 %
Lymphs Abs: 1.3 10*3/uL (ref 0.7–4.0)
MCH: 28.6 pg (ref 26.0–34.0)
MCHC: 34.7 g/dL (ref 30.0–36.0)
MCV: 82.4 fL (ref 80.0–100.0)
Monocytes Absolute: 0.4 10*3/uL (ref 0.1–1.0)
Monocytes Relative: 7 %
Neutro Abs: 3.3 10*3/uL (ref 1.7–7.7)
Neutrophils Relative %: 66 %
Platelets: 196 10*3/uL (ref 150–400)
RBC: 4.09 MIL/uL — ABNORMAL LOW (ref 4.22–5.81)
RDW: 13.2 % (ref 11.5–15.5)
WBC: 5 10*3/uL (ref 4.0–10.5)
nRBC: 0 % (ref 0.0–0.2)

## 2024-04-18 LAB — URINALYSIS, ROUTINE W REFLEX MICROSCOPIC
Bacteria, UA: NONE SEEN
Bilirubin Urine: NEGATIVE
Glucose, UA: >1000 mg/dL — AB
Hgb urine dipstick: NEGATIVE
Ketones, ur: NEGATIVE mg/dL
Leukocytes,Ua: NEGATIVE
Nitrite: NEGATIVE
Protein, ur: 30 mg/dL — AB
Specific Gravity, Urine: 1.026 (ref 1.005–1.030)
pH: 6 (ref 5.0–8.0)

## 2024-04-18 LAB — I-STAT VENOUS BLOOD GAS, ED
Acid-base deficit: 1 mmol/L (ref 0.0–2.0)
Bicarbonate: 24 mmol/L (ref 20.0–28.0)
Calcium, Ion: 1.27 mmol/L (ref 1.15–1.40)
HCT: 46 % (ref 39.0–52.0)
Hemoglobin: 15.6 g/dL (ref 13.0–17.0)
O2 Saturation: 75 %
Patient temperature: 37
Potassium: 4.1 mmol/L (ref 3.5–5.1)
Sodium: 133 mmol/L — ABNORMAL LOW (ref 135–145)
TCO2: 25 mmol/L (ref 22–32)
pCO2, Ven: 41.2 mmHg — ABNORMAL LOW (ref 44–60)
pH, Ven: 7.373 (ref 7.25–7.43)
pO2, Ven: 41 mmHg (ref 32–45)

## 2024-04-18 LAB — BASIC METABOLIC PANEL WITH GFR
Anion gap: 14 (ref 5–15)
BUN: 22 mg/dL — ABNORMAL HIGH (ref 6–20)
CO2: 23 mmol/L (ref 22–32)
Calcium: 9.6 mg/dL (ref 8.9–10.3)
Chloride: 97 mmol/L — ABNORMAL LOW (ref 98–111)
Creatinine, Ser: 1.49 mg/dL — ABNORMAL HIGH (ref 0.61–1.24)
GFR, Estimated: 53 mL/min — ABNORMAL LOW (ref >60)
Glucose, Bld: 446 mg/dL — ABNORMAL HIGH (ref 70–99)
Potassium: 4.3 mmol/L (ref 3.5–5.1)
Sodium: 133 mmol/L — ABNORMAL LOW (ref 135–145)

## 2024-04-18 LAB — TROPONIN T, HIGH SENSITIVITY: Troponin T High Sensitivity: 22 ng/L — ABNORMAL HIGH (ref <19)

## 2024-04-18 LAB — PRO BRAIN NATRIURETIC PEPTIDE: Pro Brain Natriuretic Peptide: 69.8 pg/mL (ref <300.0)

## 2024-04-18 LAB — CBG MONITORING, ED
Glucose-Capillary: 467 mg/dL — ABNORMAL HIGH (ref 70–99)
Glucose-Capillary: 362 mg/dL — ABNORMAL HIGH (ref 70–99)
Glucose-Capillary: 363 mg/dL — ABNORMAL HIGH (ref 70–99)
Glucose-Capillary: 261 mg/dL — ABNORMAL HIGH (ref 70–99)

## 2024-04-18 LAB — BETA-HYDROXYBUTYRIC ACID: Beta-Hydroxybutyric Acid: 0.13 mmol/L (ref 0.05–0.27)

## 2024-04-18 LAB — HEMOGLOBIN A1C
Hgb A1c MFr Bld: 13.2 % — ABNORMAL HIGH (ref 4.8–5.6)
Mean Plasma Glucose: 332.14 mg/dL

## 2024-04-18 MED ORDER — INSULIN ASPART 100 UNIT/ML IJ SOLN
10.0000 [IU] | Freq: Once | INTRAMUSCULAR | Status: AC
  Administered 2024-04-18: 10 [IU] via SUBCUTANEOUS

## 2024-04-18 MED ORDER — LACTATED RINGERS IV BOLUS
1000.0000 mL | INTRAVENOUS | Status: AC
  Administered 2024-04-18 (×2): 1000 mL via INTRAVENOUS

## 2024-04-18 NOTE — ED Provider Notes (Signed)
  Physical Exam  BP (!) 172/98   Pulse 75   Temp 98.2 F (36.8 C) (Oral)   Resp 18   SpO2 91%   Physical Exam  Procedures  Procedures  ED Course / MDM   Clinical Course as of 04/18/24 1612  Mon Apr 18, 2024  1134 Glucose-Capillary(!): 467 [JL]    Clinical Course User Index [JL] Antonio Concha, MD   Medical Decision Making Amount and/or Complexity of Data Reviewed Labs: ordered. Decision-making details documented in ED Course. Radiology: ordered.  Risk Prescription drug management.   Received in signout.  Hyperglycemia.  Coming down appropriately.  Not in DKA.  Will discharge home to follow-up with PCP for adjustment of medications       Antonio Arias, MD 04/18/24 [phone]

## 2024-04-18 NOTE — ED Triage Notes (Signed)
 Pt c/o hyperglycemia, advises "foggy headed, some vision blurring, feet/ ankle swelling" x 1-2wks. Advises he "might need an insulin  shot to get me over the hump." Compliant w home metformin 

## 2024-04-18 NOTE — ED Provider Notes (Signed)
 [place] EMERGENCY DEPARTMENT AT [facility] Provider Note   CSN: [id number] Arrival date & time: 04/18/24  1004     History  Chief Complaint  Patient presents with   Blood Sugar Problem    Antonio Johnson is a 63 y.o. male.  HPI   63 year old male with medical history significant for diabetes mellitus on metformin  and Jardiance, DVT not on anticoagulation presenting to the emergency department with polyuria, polydipsia, evaded blood glucoses at home with associated blurred vision, feeling foggy headed with associated foot and ankle swelling that has been ongoing for the past 1 to 2 weeks. He denies any chest pain, shortness of breath, abdominal pain, fevers or chills.  Home Medications Prior to Admission medications   Medication Sig Start Date End Date Taking? Authorizing Provider  hydrochlorothiazide (HYDRODIURIL) 25 MG tablet Take 25 mg by mouth every morning. 03/14/24  Yes [provider]  JARDIANCE 10 MG TABS tablet Take 10 mg by mouth daily. 03/29/24  Yes [provider]  metFORMIN  (GLUCOPHAGE ) 1000 MG tablet Take 1,000 mg by mouth daily. 03/29/24  Yes [provider]  olmesartan (BENICAR) 20 MG tablet Take 20 mg by mouth daily. 04/10/24  Yes [provider]  olmesartan (BENICAR) 40 MG tablet Take 40 mg by mouth daily. 03/29/24  Yes [provider]  OZEMPIC, 0.25 OR 0.5 MG/DOSE, 2 MG/3ML SOPN  04/11/24  Yes [provider]  sildenafil (VIAGRA) 50 MG tablet Take 50 mg by mouth daily as needed. 03/31/24  Yes [provider]  amLODipine  (NORVASC ) 5 MG tablet Take 1 tablet (5 mg total) by mouth daily. 06/01/23   Steinl, Kevin, MD  azithromycin  (ZITHROMAX ) 250 MG tablet Take 1 tablet (250 mg total) by mouth daily. Take first 2 tablets together, then 1 every day until finished. 09/29/23   Blinda Burger, NP  benzonatate  (TESSALON ) 100 MG capsule Take 1 capsule (100 mg total) by mouth every 8 (eight) hours. 09/29/23   Blinda Burger, NP  metFORMIN  (GLUCOPHAGE -XR) 500 MG 24 hr tablet Take 1 tablet (500 mg total) by mouth 2 (two) times daily with a meal. 06/01/23   Guadalupe Lee, MD      Allergies    Patient has no known allergies.    Review of Systems   Review of Systems  All other systems reviewed and are negative.   Physical Exam Updated Vital Signs BP (!) 181/102   Pulse 71   Temp 98.2 F (36.8 C) (Oral)   Resp 15   SpO2 99%  Physical Exam Vitals and nursing note reviewed.  Constitutional:      General: He is not in acute distress.    Appearance: He is well-developed.  HENT:     Head: Normocephalic and atraumatic.  Eyes:     Conjunctiva/sclera: Conjunctivae normal.  Cardiovascular:     Rate and Rhythm: Normal rate and regular rhythm.     Pulses: Normal pulses.  Pulmonary:     Effort: Pulmonary effort is normal. No respiratory distress.     Breath sounds: Normal breath sounds.  Abdominal:     Palpations: Abdomen is soft.     Tenderness: There is no abdominal tenderness.  Musculoskeletal:        General: No swelling.     Cervical back: Neck supple.  Skin:    General: Skin is warm and dry.     Capillary Refill: Capillary refill takes less than 2 seconds.  Neurological:     General: No  focal deficit present.     Mental Status: He is alert and oriented to person, place, and time. Mental status is at baseline.     Cranial Nerves: No cranial nerve deficit.     Sensory: No sensory deficit.     Motor: No weakness.  Psychiatric:        Mood and Affect: Mood normal.     ED Results / Procedures / Treatments   Labs (all labs ordered are listed, but only abnormal results are displayed) Labs Reviewed  BASIC METABOLIC PANEL WITH GFR - Abnormal; Notable for the following components:      Result Value   Sodium 133 (*)    Chloride 97 (*)    Glucose, Bld 446 (*)    BUN 22 (*)    Creatinine, Ser 1.49 (*)    GFR, Estimated 53 (*)    All other components within normal limits  CBC WITH  DIFFERENTIAL/PLATELET - Abnormal; Notable for the following components:   RBC 4.09 (*)    Hemoglobin 11.7 (*)    HCT 33.7 (*)    All other components within normal limits  URINALYSIS, ROUTINE W REFLEX MICROSCOPIC - Abnormal; Notable for the following components:   Glucose, UA >1,000 (*)    Protein, ur 30 (*)    All other components within normal limits  CBG MONITORING, ED - Abnormal; Notable for the following components:   Glucose-Capillary 467 (*)    All other components within normal limits  I-STAT VENOUS BLOOD GAS, ED - Abnormal; Notable for the following components:   pCO2, Ven 41.2 (*)    Sodium 133 (*)    All other components within normal limits  CBG MONITORING, ED - Abnormal; Notable for the following components:   Glucose-Capillary 362 (*)    All other components within normal limits  CBG MONITORING, ED - Abnormal; Notable for the following components:   Glucose-Capillary 363 (*)    All other components within normal limits  TROPONIN T, HIGH SENSITIVITY - Abnormal; Notable for the following components:   Troponin T High Sensitivity 22 (*)    All other components within normal limits  BETA-HYDROXYBUTYRIC ACID  PRO BRAIN NATRIURETIC PEPTIDE  HEMOGLOBIN A1C  CBG MONITORING, ED    EKG None  Radiology DG Chest Portable 1 View Result Date: 04/18/2024 CLINICAL DATA:  Leg swelling.  Diabetic ketoacidosis. EXAM: PORTABLE CHEST 1 VIEW COMPARISON:  Chest radiographs 06/01/2023 and 09/02/2016. Abdominal CT 03/02/2021. FINDINGS: 1139 hours. Two AP portable erect views are submitted. The heart size and mediastinal contours are normal. The lungs are clear. There is no pleural effusion or pneumothorax. No acute osseous findings are identified. Mild degenerative changes in the spine. IMPRESSION: No evidence of acute cardiopulmonary process. Electronically Signed   By: Elmon Hagedorn M.D.   On: 04/18/2024 12:12    Procedures Procedures    Medications Ordered in ED Medications   lactated ringers bolus 1,000 mL (0 mLs Intravenous Stopped 04/18/24 1458)  insulin  aspart (novoLOG ) injection 10 Units (10 Units Subcutaneous Given 04/18/24 1320)    ED Course/ Medical Decision Making/ A&P Clinical Course as of 04/18/24 1500  Mon Apr 18, 2024  1134 Glucose-Capillary(!): 467 [JL]    Clinical Course User Index [JL] Rosealee Concha, MD                                 Medical Decision Making Amount and/or Complexity of Data Reviewed Labs: ordered.  Decision-making details documented in ED Course. Radiology: ordered.  Risk Prescription drug management.     63 year old male with medical history significant for diabetes mellitus on metformin  and Jardiance, DVT not on anticoagulation presenting to the emergency department with polyuria, polydipsia, evaded blood glucoses at home with associated blurred vision, feeling foggy headed with associated foot and ankle swelling that has been ongoing for the past 1 to 2 weeks. He denies any chest pain, shortness of breath, abdominal pain, fevers or chills.  On arrival, the patient was afebrile, not tachycardic or tachypneic, hypertensive BP 176/99, saturating 95% on room air.  Concern for potential uncontrolled diabetes mellitus and hyperglycemia, consider DKA, HHS.  Considered DVT, patient without chest pain or shortness of breath, low concern for ACS, PE, patient neurologically intact with normal logic exam, low concern for CVA.  Initial CBG was 467.  DKA workup initiated to include VBG, BMP, CBC, due to the patient's bilateral lower extremity swelling, cardiac troponins and BNP were obtained in addition to bilateral DVT ultrasound and chest x-ray.  IV access obtained and the patient was administered an IV fluid bolus for volume resuscitation.  The patient was administered 2 L of LR in total in addition to subcutaneous insulin  10 units.  VBG was without acidosis or alkalosis, pH 7.37, bicarbonate 24, BMP with hyperglycemia to 446, no  evidence of DKA with bicarbonate 23, anion gap 14, patient creatinine appears to be at baseline for the patient's CKD.  Urinalysis negative for UTI or hematuria.  CBC without a leukocytosis, mild anemia at 11.7, BMP normal, cardiac troponin initially elevated at 22. Beta hydroxybutyrate was normal.  The patient's blood glucose were trending down to 363 on recheck.  Given the patient's bilateral lower swelling, DVT ultrasound was performed and results were pending at time of signout.  Signout given to Dr. Val Garin at 1500 to reassess the patient, follow-up results of DVT imaging, ensure continued downtrending of the patient's blood glucoses following the above interventions with likely plan for outpatient management pending reassessment and results.   Final Clinical Impression(s) / ED Diagnoses Final diagnoses:  Hyperglycemia due to diabetes mellitus (HCC)  Leg swelling    Rx / DC Orders ED Discharge Orders     None         Rosealee Concha, MD 04/18/24 1500

## 2024-04-18 NOTE — ED Notes (Signed)
 DC paperwork given and verbally understood.

## 2024-04-22 ENCOUNTER — Ambulatory Visit (HOSPITAL_COMMUNITY): Payer: Self-pay

## 2024-09-29 ENCOUNTER — Emergency Department (HOSPITAL_BASED_OUTPATIENT_CLINIC_OR_DEPARTMENT_OTHER)

## 2024-09-29 ENCOUNTER — Emergency Department (HOSPITAL_BASED_OUTPATIENT_CLINIC_OR_DEPARTMENT_OTHER)
Admission: EM | Admit: 2024-09-29 | Discharge: 2024-09-29 | Disposition: A | Discharge status: Home / Self Care | Attending: Emergency Medicine | Admitting: Emergency Medicine

## 2024-09-29 ENCOUNTER — Other Ambulatory Visit: Payer: Self-pay

## 2024-09-29 DIAGNOSIS — R42 Dizziness and giddiness: Secondary | ICD-10-CM | POA: Insufficient documentation

## 2024-09-29 DIAGNOSIS — E1165 Type 2 diabetes mellitus with hyperglycemia: Secondary | ICD-10-CM | POA: Diagnosis not present

## 2024-09-29 DIAGNOSIS — Z7984 Long term (current) use of oral hypoglycemic drugs: Secondary | ICD-10-CM | POA: Insufficient documentation

## 2024-09-29 DIAGNOSIS — I10 Essential (primary) hypertension: Secondary | ICD-10-CM | POA: Insufficient documentation

## 2024-09-29 LAB — CBC
HCT: 33.5 % — ABNORMAL LOW (ref 39.0–52.0)
Hemoglobin: 11.7 g/dL — ABNORMAL LOW (ref 13.0–17.0)
MCH: 28.4 pg (ref 26.0–34.0)
MCHC: 34.9 g/dL (ref 30.0–36.0)
MCV: 81.3 fL (ref 80.0–100.0)
Platelets: 221 K/uL (ref 150–400)
RBC: 4.12 MIL/uL — ABNORMAL LOW (ref 4.22–5.81)
RDW: 13 % (ref 11.5–15.5)
WBC: 4.9 K/uL (ref 4.0–10.5)
nRBC: 0 % (ref 0.0–0.2)

## 2024-09-29 LAB — COMPREHENSIVE METABOLIC PANEL WITH GFR
ALT: 30 U/L (ref 0–44)
AST: 25 U/L (ref 15–41)
Albumin: 4 g/dL (ref 3.5–5.0)
Alkaline Phosphatase: 73 U/L (ref 38–126)
Anion gap: 14 (ref 5–15)
BUN: 28 mg/dL — ABNORMAL HIGH (ref 8–23)
CO2: 22 mmol/L (ref 22–32)
Calcium: 10.3 mg/dL (ref 8.9–10.3)
Chloride: 98 mmol/L (ref 98–111)
Creatinine, Ser: 1.71 mg/dL — ABNORMAL HIGH (ref 0.61–1.24)
GFR, Estimated: 45 mL/min — ABNORMAL LOW (ref >60)
Glucose, Bld: 376 mg/dL — ABNORMAL HIGH (ref 70–99)
Potassium: 4 mmol/L (ref 3.5–5.1)
Sodium: 134 mmol/L — ABNORMAL LOW (ref 135–145)
Total Bilirubin: 0.4 mg/dL (ref 0.0–1.2)
Total Protein: 7.9 g/dL (ref 6.5–8.1)

## 2024-09-29 MED ORDER — MECLIZINE HCL 25 MG PO TABS: 25.0000 mg | ORAL_TABLET | Freq: Three times a day (TID) | ORAL | 0 refills | Status: AC | PRN

## 2024-09-29 MED ORDER — MECLIZINE HCL 25 MG PO TABS
25.0000 mg | ORAL_TABLET | Freq: Once | ORAL | Status: AC
  Administered 2024-09-29: 25 mg via ORAL
  Filled 2024-09-29: qty 1

## 2024-09-29 MED ORDER — SODIUM CHLORIDE 0.9 % IV BOLUS
1000.0000 mL | Freq: Once | INTRAVENOUS | Status: AC
  Administered 2024-09-29: 1000 mL via INTRAVENOUS

## 2024-09-29 NOTE — Discharge Instructions (Signed)
 Begin taking meclizine as prescribed.  Stay hydrated and drink plenty of fluids over the next few days.  May try the Epley maneuver once a day on both sides to help with dizziness as well.  There is an instruction sheet explaining how to do this.  Please follow-up with PCP next week for recheck.  Return to ED sooner if any symptoms worsen including severe controllable dizziness, severe headaches, uncontrollable nausea/vomiting, syncopal episode.

## 2024-09-29 NOTE — ED Triage Notes (Signed)
 Patient states dizziness since yesterday morning. Worse with movement. Endorses nausea.

## 2024-09-29 NOTE — ED Provider Notes (Signed)
 [place] EMERGENCY DEPARTMENT AT [facility] Provider Note   CSN: [id number] Arrival date & time: 09/29/24  1044     Patient presents with: Dizziness   Antonio Johnson is a 63 y.o. male.   Patient is a 63 year old male with a history of hypertension and diabetes who presents to the ED for intermittent dizziness that began yesterday when he woke up.  Patient notes when he stood up yesterday, the room was spinning.  He states he had intermittent symptoms throughout the day yesterday.  He states he felt fine when he went to bed last night but symptoms again began when he woke up this morning.  Notes he feels fine sitting still but when he stands up the room does spin.  Notes some nausea last night but denies any vomiting.  States his blood pressure medication was recently changed but he is unsure to what.  States he does not check his blood pressure or blood sugars at home regularly.  Denies fevers, headaches, vision changes, chest pain, shortness of breath.  No further complaints.   Dizziness Associated symptoms: nausea   Associated symptoms: no chest pain, no headaches, no shortness of breath, no vomiting and no weakness        Prior to Admission medications   Medication Sig Start Date End Date Taking? Authorizing Provider  meclizine  (ANTIVERT ) 25 MG tablet Take 1 tablet (25 mg total) by mouth 3 (three) times daily as needed for dizziness. 09/29/24  Yes Kapena Hamme, Thersia RAMAN, PA-C  amLODipine  (NORVASC ) 5 MG tablet Take 1 tablet (5 mg total) by mouth daily. 06/01/23   Steinl, Kevin, MD  azithromycin  (ZITHROMAX ) 250 MG tablet Take 1 tablet (250 mg total) by mouth daily. Take first 2 tablets together, then 1 every day until finished. 09/29/23   Richad Jon HERO, NP  benzonatate  (TESSALON ) 100 MG capsule Take 1 capsule (100 mg total) by mouth every 8 (eight) hours. 09/29/23   Richad Jon HERO, NP  hydrochlorothiazide (HYDRODIURIL) 25 MG tablet Take 25 mg by mouth every morning. 03/14/24    [provider]  JARDIANCE 10 MG TABS tablet Take 10 mg by mouth daily. 03/29/24   [provider]  metFORMIN  (GLUCOPHAGE ) 1000 MG tablet Take 1,000 mg by mouth daily. 03/29/24   [provider]  metFORMIN  (GLUCOPHAGE -XR) 500 MG 24 hr tablet Take 1 tablet (500 mg total) by mouth 2 (two) times daily with a meal. 06/01/23   Bernard Drivers, MD  olmesartan (BENICAR) 20 MG tablet Take 20 mg by mouth daily. 04/10/24   [provider]  olmesartan (BENICAR) 40 MG tablet Take 40 mg by mouth daily. 03/29/24   [provider]  OZEMPIC, 0.25 OR 0.5 MG/DOSE, 2 MG/3ML SOPN  04/11/24   [provider]  sildenafil (VIAGRA) 50 MG tablet Take 50 mg by mouth daily as needed. 03/31/24   [provider]    Allergies: Patient has no known allergies.    Review of Systems  Constitutional:  Negative for chills and fever.  Respiratory:  Negative for shortness of breath.   Cardiovascular:  Negative for chest pain.  Gastrointestinal:  Positive for nausea. Negative for abdominal pain and vomiting.  Neurological:  Positive for dizziness. Negative for syncope, weakness and headaches.    Updated Vital Signs BP (!) 155/89 (BP Location: Right Arm)   Pulse 86   Temp 98.4 F (36.9 C) (Oral)   Resp 16   SpO2 97%   Physical Exam Constitutional:  Appearance: Normal appearance.  HENT:     Head: Normocephalic and atraumatic.     Nose: Nose normal.     Mouth/Throat:     Mouth: Mucous membranes are moist.     Pharynx: Oropharynx is clear.  Eyes:     Pupils: Pupils are equal, round, and reactive to light.  Cardiovascular:     Rate and Rhythm: Normal rate.  Pulmonary:     Effort: Pulmonary effort is normal.  Skin:    General: Skin is warm and dry.  Neurological:     Mental Status: He is alert and oriented to person, place, and time. Mental status is at baseline.     Cranial Nerves: No cranial nerve deficit.     Comments: Equal handgrip bilaterally.  Radial  pulses 2+ bilaterally  Psychiatric:        Mood and Affect: Mood normal.        Behavior: Behavior normal.     (all labs ordered are listed, but only abnormal results are displayed) Labs Reviewed  COMPREHENSIVE METABOLIC PANEL WITH GFR - Abnormal; Notable for the following components:      Result Value   Sodium 134 (*)    Glucose, Bld 376 (*)    BUN 28 (*)    Creatinine, Ser 1.71 (*)    GFR, Estimated 45 (*)    All other components within normal limits  CBC - Abnormal; Notable for the following components:   RBC 4.12 (*)    Hemoglobin 11.7 (*)    HCT 33.5 (*)    All other components within normal limits    EKG: None  Radiology: CT Head Wo Contrast Result Date: 09/29/2024 EXAM: CT HEAD WITHOUT CONTRAST 09/29/2024 03:51:00 PM TECHNIQUE: CT of the head was performed without the administration of intravenous contrast. Automated exposure control, iterative reconstruction, and/or weight based adjustment of the mA/kV was utilized to reduce the radiation dose to as low as reasonably achievable. COMPARISON: CT head dated 11/26/2019. Impression: No acute intracranial hemorrhage or calvarial fracture. CLINICAL HISTORY: Vertigo, central. FINDINGS: BRAIN AND VENTRICLES: No acute intracranial hemorrhage. No evidence of acute infarct. No hydrocephalus. No extra-axial collection. No mass effect or midline shift. ORBITS: No acute abnormality. SINUSES: No acute abnormality. SOFT TISSUES AND SKULL: No acute soft tissue abnormality. No calvarial fracture. Cerumen in bilateral external auditory canals. IMPRESSION: 1. No acute intracranial abnormality. Electronically signed by: prentice bybordi 09/29/2024 04:10 PM EST RP Workstation: [id number]      Medications Ordered in the ED  sodium chloride  0.9 % bolus 1,000 mL (0 mLs Intravenous Stopped 09/29/24 1648)  meclizine  (ANTIVERT ) tablet 25 mg (25 mg Oral Given 09/29/24 1434)    Clinical Course as of 09/29/24 1715  Thu Sep 29, 2024  1426  Hemoglobin(!): 11.7 Stable compared to baseline [AY]  1426 Glucose(!): 376 [AY]  1426 Creatinine(!): 1.71 Mild AKI [AY]    Clinical Course User Index [AY] Neysa Thersia RAMAN, PA-C                                Medical Decision Making Amount and/or Complexity of Data Reviewed Labs: ordered. Decision-making details documented in ED Course. Radiology: ordered.   Patient is a 63 year old male with a history of hypertension and type 2 diabetes who presents to the ED for intermittent dizziness that began yesterday.  Please see detailed HPI above.  On exam patient is alert and in no acute distress.  Physical exam  as noted above.  No acute cranial nerve deficits noted.  Differential includes BPPV, electrolyte abnormality, dehydration, intracranial abnormality, orthostatic hypotension.  CT of the head today is unremarkable.  CMP reveals hyperglycemia at 376 which is similar to patient's previous.  Notes he does not check his blood sugars at home.  He was given a liter of fluids.  Mild AKI with a creatinine of 1.71 noted as well.  Hemoglobin similar to baseline at 11.7.  CT of the head today obtained.  This was reviewed and negative for acute process.  Patient notes that his dizziness is positional upon standing and turning head.  Suspect BPPV most likely cause of symptoms.  He was given meclizine  and states symptoms are improving.  Otherwise well-appearing and vital signs stable.  Stable for discharge home.  Prescribed meclizine .  Instructions for Epley maneuver provided.  Advised to follow-up with PCP in 1 week for recheck.  Strict return precautions provided for worsening symptoms.     Final diagnoses:  Dizziness    ED Discharge Orders          Ordered    meclizine  (ANTIVERT ) 25 MG tablet  3 times daily PRN        09/29/24 1638               Neysa Thersia RAMAN, PA-C 09/29/24 1715    Mannie Pac T, DO 09/30/24 [phone]

## 2024-09-29 NOTE — ED Notes (Signed)
 While standing pt still feeling dizzy.

## 2024-11-11 ENCOUNTER — Ambulatory Visit (INDEPENDENT_AMBULATORY_CARE_PROVIDER_SITE_OTHER): Admitting: Podiatry

## 2024-11-11 ENCOUNTER — Telehealth: Payer: Self-pay | Admitting: Podiatry

## 2024-11-11 ENCOUNTER — Encounter: Payer: Self-pay | Admitting: Podiatry

## 2024-11-11 ENCOUNTER — Ambulatory Visit

## 2024-11-11 ENCOUNTER — Ambulatory Visit: Admitting: Podiatry

## 2024-11-11 DIAGNOSIS — K529 Noninfective gastroenteritis and colitis, unspecified: Secondary | ICD-10-CM | POA: Insufficient documentation

## 2024-11-11 DIAGNOSIS — M79675 Pain in left toe(s): Secondary | ICD-10-CM

## 2024-11-11 DIAGNOSIS — M79674 Pain in right toe(s): Secondary | ICD-10-CM | POA: Diagnosis not present

## 2024-11-11 DIAGNOSIS — E08621 Diabetes mellitus due to underlying condition with foot ulcer: Secondary | ICD-10-CM

## 2024-11-11 DIAGNOSIS — E1140 Type 2 diabetes mellitus with diabetic neuropathy, unspecified: Secondary | ICD-10-CM | POA: Diagnosis not present

## 2024-11-11 DIAGNOSIS — B351 Tinea unguium: Secondary | ICD-10-CM | POA: Diagnosis not present

## 2024-11-11 DIAGNOSIS — L84 Corns and callosities: Secondary | ICD-10-CM

## 2024-11-11 DIAGNOSIS — K5900 Constipation, unspecified: Secondary | ICD-10-CM | POA: Insufficient documentation

## 2024-11-11 DIAGNOSIS — R1033 Periumbilical pain: Secondary | ICD-10-CM | POA: Insufficient documentation

## 2024-11-11 NOTE — Patient Instructions (Signed)
 Look for urea 40% cream or ointment and apply to the thickened dry skin / calluses. This can be bought over the counter, at a pharmacy or online such as [employer].    More silicone pads can be purchased from:  [email or web address]

## 2024-11-11 NOTE — Progress Notes (Signed)
 "      Chief Complaint  Patient presents with   Diabetic Ulcer    URGENT WORK IN -  Walk-in; NEW PATIENT - Diabetic Right  second toe preulcerative callus/corn that is causing pain (One is developing on the left foot as well)    Diabetes    A1C  13+   Nail Problem    Thick painful toenails that he cannot trim himself    HPI: 64 y.o. male presents today with the above complaint.  He is concerned about development of ulcers due to calluses.  History confirmed with patient. Patient presenting with pain related to dystrophic thickened elongated nails. Patient is unable to trim own nails related to nail dystrophy. Patient does have a history of poorly controlled T2DM, recent A1c above 13.  Exam is significant calluses on toes, bilateral first toe interphalangeal joint and right subfifth metatarsal head.  He denies history of wounds.  Past Medical History:  Diagnosis Date   Allergy    Chest pain    Clotting disorder    Diabetes mellitus without complication (HCC)    Dvt femoral (deep venous thrombosis) (HCC)    Hypertension     Past Surgical History:  Procedure Laterality Date   WISDOM TOOTH EXTRACTION      Allergies[1]  ROS    Physical Exam: There were no vitals filed for this visit.  General: The patient is alert and oriented x3 in no acute distress.  Dermatology: Skin is warm and dry.  Atrophic.  Interspaces clear of maceration and debris.  Nailplates x 10 are thickened greater than 3 mm, elongated, dystrophic with yellow discoloration and subungual debris consistent with mycotic appearance.  The nail plates x 10 are tender on direct dorsal palpation.  There are significant preulcerative calluses left second toe distal tuft, right second toe distal tuft medial aspect, bilateral first toe interphalangeal joint level, plantar right subfifth metatarsal head.  Vascular: DP pulses palpable bilaterally, PT pulses diminished bilaterally.. Capillary refill approximately 3 seconds to  digits.  Pedal hair growth absent.  No appreciable edema.  No erythema or calor.  Neurological: Light touch sensation decreased to the toes.  Protective sensation decreased.  Vibratory sensation decreased.  Musculoskeletal Exam: Muscle strength 5/5 in dorsiflexion, plantarflexion inversion and eversion.  Bilateral hallux limitus with first MPJ dorsiflexion less than 25 degrees.  Accompanied bunion deformities with second toe crossover deformities developing.  Decreased ankle joint dorsiflexion less than 10 degrees with knees extended.    Assessment/Plan of Care: 1. Type 2 diabetes mellitus with diabetic neuropathy, without long-term current use of insulin  (HCC)   2. Pain due to onychomycosis of toenails of both feet   3. Pre-ulcerative calluses      No orders of the defined types were placed in this encounter.  None  Discussed clinical findings with patient today.  # Diabetes with neuropathy, diminished PT pulses -Patient educated on diabetes. Discussed proper diabetic foot care and discussed risks and complications of disease, importance of tight glucose control. Educated patient in depth on reasons to return to the office immediately should he/she discover anything concerning or new on the feet. All questions answered. Discussed proper shoes as well.  - New patient visit, patient has not previously seen podiatry.  At risk footcare due to diminished PT pulses, trophic changes, decreased pedal hair growth  #Onychomycosis with pain  -Nails palliatively debrided as below. -Educated on self-care  Procedure: Nail Debridement Rationale: Pain Type of Debridement: manual, sharp debridement. Instrumentation: Nail nipper,  rotary burr. Number of Nails: 10  # Preulcerative calluses bilateral first interphalangeal joint, bilateral second toe distal tuft, right subfifth metatarsal head All symptomatic hyperkeratoses x5 were safely debrided with a sterile #15 blade to patient's level of comfort  without incident. We discussed preventative and palliative care of these lesions including supportive and accommodative shoegear, padding, prefabricated and custom molded accommodative orthoses, use of a pumice stone and lotions/creams daily.  Follow-up in 3 months for at risk diabetic footcare   Shalyn Koral L. Lamount MAUL, AACFAS [facility]     [address] [place], [place] [address]                Office [phone]  Fax [phone]     [1] No Known Allergies  "

## 2024-11-11 NOTE — Telephone Encounter (Signed)
 Randall Hartshorn (Physician Assistant) from [employer] [facility] [employer] [facility] called requesting documentation about any restrictions the patient has, and when he can return to work. Her telephone number is [phone] and fax number [phone].

## 2025-02-13 ENCOUNTER — Ambulatory Visit: Admitting: Podiatry
